# Patient Record
Sex: Female | Born: 1970 | Race: White | Hispanic: No | Marital: Single | Smoking: Never smoker
Health system: Southern US, Community
[De-identification: ages and names within clinical notes are randomized; demographics above are authoritative.]

## PROBLEM LIST (undated history)

## (undated) DIAGNOSIS — Z87442 Personal history of urinary calculi: Secondary | ICD-10-CM

## (undated) DIAGNOSIS — K5900 Constipation, unspecified: Secondary | ICD-10-CM

## (undated) DIAGNOSIS — N939 Abnormal uterine and vaginal bleeding, unspecified: Secondary | ICD-10-CM

## (undated) DIAGNOSIS — K219 Gastro-esophageal reflux disease without esophagitis: Secondary | ICD-10-CM

## (undated) DIAGNOSIS — G43829 Menstrual migraine, not intractable, without status migrainosus: Secondary | ICD-10-CM

## (undated) DIAGNOSIS — E039 Hypothyroidism, unspecified: Secondary | ICD-10-CM

## (undated) DIAGNOSIS — D649 Anemia, unspecified: Secondary | ICD-10-CM

## (undated) DIAGNOSIS — Z8489 Family history of other specified conditions: Secondary | ICD-10-CM

## (undated) DIAGNOSIS — M1990 Unspecified osteoarthritis, unspecified site: Secondary | ICD-10-CM

## (undated) DIAGNOSIS — D259 Leiomyoma of uterus, unspecified: Secondary | ICD-10-CM

## (undated) DIAGNOSIS — F419 Anxiety disorder, unspecified: Secondary | ICD-10-CM

## (undated) HISTORY — PX: CHOLECYSTECTOMY: SHX55

## (undated) HISTORY — DX: Gastro-esophageal reflux disease without esophagitis: K21.9

## (undated) HISTORY — DX: Anxiety disorder, unspecified: F41.9

## (undated) HISTORY — DX: Hypothyroidism, unspecified: E03.9

## (undated) HISTORY — DX: Anemia, unspecified: D64.9

## (undated) HISTORY — DX: Menstrual migraine, not intractable, without status migrainosus: G43.829

---

## 1998-08-21 ENCOUNTER — Ambulatory Visit (HOSPITAL_COMMUNITY): Admission: RE | Admit: 1998-08-21 | Discharge: 1998-08-21 | Payer: Self-pay | Admitting: Obstetrics and Gynecology

## 1998-08-21 ENCOUNTER — Encounter: Payer: Self-pay | Admitting: Obstetrics and Gynecology

## 1999-01-05 ENCOUNTER — Encounter: Payer: Self-pay | Admitting: Obstetrics and Gynecology

## 1999-01-05 ENCOUNTER — Inpatient Hospital Stay (HOSPITAL_COMMUNITY): Admission: AD | Admit: 1999-01-05 | Discharge: 1999-01-09 | Payer: Self-pay | Admitting: Obstetrics & Gynecology

## 1999-01-18 ENCOUNTER — Encounter (HOSPITAL_COMMUNITY): Admission: RE | Admit: 1999-01-18 | Discharge: 1999-04-18 | Payer: Self-pay | Admitting: Obstetrics and Gynecology

## 1999-02-08 ENCOUNTER — Other Ambulatory Visit: Admission: RE | Admit: 1999-02-08 | Discharge: 1999-02-08 | Payer: Self-pay | Admitting: Obstetrics and Gynecology

## 2000-02-27 ENCOUNTER — Encounter: Payer: Self-pay | Admitting: Family Medicine

## 2000-02-27 LAB — CONVERTED CEMR LAB

## 2000-03-03 ENCOUNTER — Other Ambulatory Visit: Admission: RE | Admit: 2000-03-03 | Discharge: 2000-03-03 | Payer: Self-pay | Admitting: Obstetrics and Gynecology

## 2001-03-04 ENCOUNTER — Other Ambulatory Visit: Admission: RE | Admit: 2001-03-04 | Discharge: 2001-03-04 | Payer: Self-pay | Admitting: Obstetrics and Gynecology

## 2002-05-24 ENCOUNTER — Other Ambulatory Visit: Admission: RE | Admit: 2002-05-24 | Discharge: 2002-05-24 | Payer: Self-pay | Admitting: Obstetrics and Gynecology

## 2004-06-04 ENCOUNTER — Ambulatory Visit: Payer: Self-pay | Admitting: Family Medicine

## 2004-07-19 ENCOUNTER — Ambulatory Visit: Payer: Self-pay | Admitting: Family Medicine

## 2004-07-29 LAB — CONVERTED CEMR LAB: Pap Smear: NORMAL

## 2005-03-13 ENCOUNTER — Emergency Department (HOSPITAL_COMMUNITY): Admission: EM | Admit: 2005-03-13 | Discharge: 2005-03-13 | Payer: Self-pay | Admitting: Family Medicine

## 2005-06-24 ENCOUNTER — Ambulatory Visit: Payer: Self-pay | Admitting: Family Medicine

## 2005-09-06 ENCOUNTER — Other Ambulatory Visit: Admission: RE | Admit: 2005-09-06 | Discharge: 2005-09-06 | Payer: Self-pay | Admitting: Obstetrics and Gynecology

## 2006-02-11 ENCOUNTER — Ambulatory Visit: Payer: Self-pay | Admitting: Family Medicine

## 2006-02-11 LAB — CONVERTED CEMR LAB: TSH: 6.4 microintl units/mL

## 2006-07-17 ENCOUNTER — Ambulatory Visit: Payer: Self-pay | Admitting: Family Medicine

## 2007-07-08 ENCOUNTER — Telehealth: Payer: Self-pay | Admitting: Family Medicine

## 2007-09-10 ENCOUNTER — Encounter: Payer: Self-pay | Admitting: Family Medicine

## 2007-09-10 DIAGNOSIS — F329 Major depressive disorder, single episode, unspecified: Secondary | ICD-10-CM

## 2007-09-10 DIAGNOSIS — K219 Gastro-esophageal reflux disease without esophagitis: Secondary | ICD-10-CM

## 2007-09-10 DIAGNOSIS — J45909 Unspecified asthma, uncomplicated: Secondary | ICD-10-CM | POA: Insufficient documentation

## 2007-09-10 DIAGNOSIS — E039 Hypothyroidism, unspecified: Secondary | ICD-10-CM | POA: Insufficient documentation

## 2007-11-26 ENCOUNTER — Telehealth: Payer: Self-pay | Admitting: Family Medicine

## 2007-11-27 ENCOUNTER — Encounter (INDEPENDENT_AMBULATORY_CARE_PROVIDER_SITE_OTHER): Payer: Self-pay | Admitting: *Deleted

## 2007-12-15 ENCOUNTER — Ambulatory Visit: Payer: Self-pay | Admitting: Family Medicine

## 2007-12-15 DIAGNOSIS — R5383 Other fatigue: Secondary | ICD-10-CM

## 2007-12-15 DIAGNOSIS — R5381 Other malaise: Secondary | ICD-10-CM | POA: Insufficient documentation

## 2007-12-18 LAB — CONVERTED CEMR LAB
Bilirubin, Direct: 0.1 mg/dL (ref 0.0–0.3)
Calcium: 8.4 mg/dL (ref 8.4–10.5)
Creatinine, Ser: 0.8 mg/dL (ref 0.4–1.2)
Eosinophils Absolute: 0.1 10*3/uL (ref 0.0–0.7)
Eosinophils Relative: 2.2 % (ref 0.0–5.0)
Folate: 6.5 ng/mL
GFR calc non Af Amer: 86 mL/min
HCT: 35.7 % — ABNORMAL LOW (ref 36.0–46.0)
HDL: 39.6 mg/dL (ref >39.0)
MCV: 77.4 fL — ABNORMAL LOW (ref 78.0–100.0)
Monocytes Absolute: 0.3 10*3/uL (ref 0.1–1.0)
Monocytes Relative: 5.7 % (ref 3.0–12.0)
Neutrophils Relative %: 59.6 % (ref 43.0–77.0)
Platelets: 213 10*3/uL (ref 150–400)
RDW: 14.8 % — ABNORMAL HIGH (ref 11.5–14.6)
Sodium: 141 meq/L (ref 135–145)
TSH: 2.68 microintl units/mL (ref 0.35–5.50)
Total Bilirubin: 0.6 mg/dL (ref 0.3–1.2)
Total CHOL/HDL Ratio: 4.6
Triglycerides: 86 mg/dL (ref 0–149)
VLDL: 17 mg/dL (ref 0–40)
Vitamin B-12: 329 pg/mL (ref 211–911)
WBC: 5.9 10*3/uL (ref 4.5–10.5)

## 2008-04-19 ENCOUNTER — Telehealth: Payer: Self-pay | Admitting: Family Medicine

## 2008-04-21 ENCOUNTER — Emergency Department: Payer: Self-pay | Admitting: Emergency Medicine

## 2008-07-16 ENCOUNTER — Encounter: Payer: Self-pay | Admitting: Family Medicine

## 2008-08-10 ENCOUNTER — Ambulatory Visit: Payer: Self-pay | Admitting: Family Medicine

## 2008-08-10 DIAGNOSIS — D649 Anemia, unspecified: Secondary | ICD-10-CM

## 2008-08-11 LAB — CONVERTED CEMR LAB
AST: 22 units/L (ref 0–37)
Basophils Absolute: 0 10*3/uL (ref 0.0–0.1)
Basophils Relative: 0.6 % (ref 0.0–3.0)
Bilirubin, Direct: 0.1 mg/dL (ref 0.0–0.3)
Chloride: 104 meq/L (ref 96–112)
Cholesterol: 181 mg/dL (ref 0–200)
Creatinine, Ser: 0.6 mg/dL (ref 0.4–1.2)
Eosinophils Absolute: 0.2 10*3/uL (ref 0.0–0.7)
GFR calc non Af Amer: 120 mL/min
HDL: 48 mg/dL (ref >39.0)
LDL Cholesterol: 124 mg/dL — ABNORMAL HIGH (ref 0–99)
MCHC: 33.7 g/dL (ref 30.0–36.0)
MCV: 75.3 fL — ABNORMAL LOW (ref 78.0–100.0)
Neutrophils Relative %: 61.8 % (ref 43.0–77.0)
Platelets: 205 10*3/uL (ref 150–400)
RDW: 15.1 % — ABNORMAL HIGH (ref 11.5–14.6)
Sodium: 139 meq/L (ref 135–145)
TSH: 3.8 microintl units/mL (ref 0.35–5.50)
Total Bilirubin: 0.5 mg/dL (ref 0.3–1.2)
Triglycerides: 43 mg/dL (ref 0–149)
VLDL: 9 mg/dL (ref 0–40)

## 2008-09-27 ENCOUNTER — Ambulatory Visit: Payer: Self-pay | Admitting: Family Medicine

## 2008-09-29 ENCOUNTER — Telehealth (INDEPENDENT_AMBULATORY_CARE_PROVIDER_SITE_OTHER): Payer: Self-pay | Admitting: Internal Medicine

## 2008-10-01 ENCOUNTER — Telehealth: Payer: Self-pay | Admitting: Internal Medicine

## 2008-10-06 ENCOUNTER — Telehealth: Payer: Self-pay | Admitting: Family Medicine

## 2008-12-23 ENCOUNTER — Telehealth (INDEPENDENT_AMBULATORY_CARE_PROVIDER_SITE_OTHER): Payer: Self-pay | Admitting: *Deleted

## 2008-12-28 ENCOUNTER — Ambulatory Visit: Payer: Self-pay | Admitting: Family Medicine

## 2008-12-28 DIAGNOSIS — E785 Hyperlipidemia, unspecified: Secondary | ICD-10-CM

## 2009-04-17 ENCOUNTER — Telehealth: Payer: Self-pay | Admitting: Family Medicine

## 2009-05-02 ENCOUNTER — Telehealth: Payer: Self-pay | Admitting: Family Medicine

## 2009-09-07 ENCOUNTER — Telehealth: Payer: Self-pay | Admitting: Family Medicine

## 2009-10-14 ENCOUNTER — Emergency Department: Payer: Self-pay | Admitting: Emergency Medicine

## 2010-02-21 ENCOUNTER — Telehealth: Payer: Self-pay | Admitting: Family Medicine

## 2010-04-13 ENCOUNTER — Emergency Department (HOSPITAL_COMMUNITY): Admission: EM | Admit: 2010-04-13 | Discharge: 2010-04-13 | Payer: Self-pay | Admitting: Emergency Medicine

## 2010-04-23 ENCOUNTER — Telehealth: Payer: Self-pay | Admitting: Family Medicine

## 2010-05-27 ENCOUNTER — Inpatient Hospital Stay: Payer: Self-pay | Admitting: Emergency Medicine

## 2010-06-08 ENCOUNTER — Telehealth (INDEPENDENT_AMBULATORY_CARE_PROVIDER_SITE_OTHER): Payer: Self-pay | Admitting: *Deleted

## 2010-06-11 ENCOUNTER — Telehealth: Payer: Self-pay | Admitting: Family Medicine

## 2010-06-13 ENCOUNTER — Encounter (INDEPENDENT_AMBULATORY_CARE_PROVIDER_SITE_OTHER): Payer: Self-pay | Admitting: *Deleted

## 2010-06-15 ENCOUNTER — Ambulatory Visit: Payer: Self-pay | Admitting: Family Medicine

## 2010-06-17 LAB — CONVERTED CEMR LAB
ALT: 16 units/L (ref 0–35)
AST: 19 units/L (ref 0–37)
BUN: 16 mg/dL (ref 6–23)
Bilirubin, Direct: 0.1 mg/dL (ref 0.0–0.3)
Cholesterol: 214 mg/dL — ABNORMAL HIGH (ref 0–200)
Creatinine, Ser: 0.7 mg/dL (ref 0.4–1.2)
Eosinophils Relative: 3.7 % (ref 0.0–5.0)
GFR calc non Af Amer: 104 mL/min (ref >60)
HDL: 46 mg/dL (ref >39.00)
Monocytes Relative: 6 % (ref 3.0–12.0)
Neutrophils Relative %: 55.6 % (ref 43.0–77.0)
Platelets: 216 10*3/uL (ref 150.0–400.0)
Potassium: 3.9 meq/L (ref 3.5–5.1)
Total Bilirubin: 0.5 mg/dL (ref 0.3–1.2)
VLDL: 18.8 mg/dL (ref 0.0–40.0)
WBC: 5.6 10*3/uL (ref 4.5–10.5)

## 2010-07-17 ENCOUNTER — Ambulatory Visit: Payer: Self-pay | Admitting: Family Medicine

## 2010-08-28 NOTE — Progress Notes (Signed)
Summary: Levothyroxin 125 mcg refill  Phone Note Refill Request Call back at [phone] Message from:  [facility] on February 21, 2010 11:12 AM  Refills Requested: Medication #1:  SYNTHROID 125 MCG  TABS take one by mouth daily [facility] electronically requested refill for Levothyroxine 125 micrograms. No refill date sent.  On 04/21/09 one year refill given to pt to [facility]. Unable to reach pt but noticed pt was seen 12/28/2008 for CPX and was to return for pap smear. Pt cancelled 01/02/09, 01/03/09 and 01/10/09. Pt recently cancelled 12/27/2009. Please advise.    Method Requested: Telephone to Pharmacy Initial call taken by: Lewanda Rife LPN,  February 21, 2010 11:14 AM  Follow-up for Phone Call        she is overdue for lab and visit refil 1 mo ask her to f/u please  Follow-up by: Judith Part MD,  February 21, 2010 12:10 PM  Additional Follow-up for Phone Call Additional follow up Details #1::        Unable to reach pt by phone. Added note to rx for pt to call for lab appt and f/u appt with Dr Milinda Antis. Medication sent electronically to [facility].Lewanda Rife LPN  February 21, 2010 1:09 PM

## 2010-08-28 NOTE — Progress Notes (Signed)
----   Converted from flag ---- ---- 06/07/2010 6:48 PM, Colon Flattery Tower MD wrote: please check wellness/ lipid v70.0 and 272 and 244.9 thanks   ---- 06/07/2010 2:01 PM, Liane Comber CMA (AAMA) wrote: Lab orders please! Good Morning! This pt is scheduled for cpx labs Wed, which labs to draw and dx codes to use? Thanks Tasha ------------------------------

## 2010-08-28 NOTE — Progress Notes (Signed)
Summary: call a nurse   Phone Note Call from Patient   Summary of Call: Triage Record Num: [id number] Operator: Durward Mallard DiMatteis Patient Name: Miranda Gardner Call Date & Time: 04/21/2010 10:38:00AM Patient Phone: [phone] PCP: Audrie Gallus. Tower Patient Gender: Female PCP Fax : Patient DOB: [birth year] Practice Name: [facility] Reason for Call: Mom calling and states that daughter has severe vomiting and diarrhea; would like phenergan suppositories; the patient's daughter had the stomach bug the other day and now [name] has the sx; pt has had no diarrhea today; x1 last night; vomited x 2 so far today; pt is nausea; no fever; phenrgan 25mg  supp 1 q4hr prn #6 NR; [facility] [phone]; homecare nausea and vomiting guideline Protocol(s) Used: Nausea or Vomiting Recommended Outcome per Protocol: Provide Home/Self Care Reason for Outcome: All other situations Care Advice:  ~ SYMPTOM / CONDITION MANAGEMENT Nausea Care Advice: - Drink small amounts of clear, sweetened liquids or ice cold drinks. - Eat light, bland foods such as saltine crackers or plain bread. - Do not eat high fat, highly seasoned, high fiber, or high sugar content foods. - Avoid mixing hot food and cold foods. - Eat smaller, more frequent meals. - Rest as much as possible in a sitting or in a propped lying position. Do not lie flat for at least 2 hours after eating. - Do not take pain medication (such as aspirin, NSAIDs) while nauseated. - Rest as much as possible until symptoms improve since activity may worsen nausea.  ~ 09/ Initial call taken by: Melody Comas,  April 23, 2010 9:16 AM

## 2010-08-28 NOTE — Progress Notes (Signed)
Summary: Sertraline  Phone Note Refill Request Message from:  [employer] on June 11, 2010 5:11 PM  Refills Requested: Medication #1:  ZOLOFT 100 MG  TABS 1 1/2 [facility] mouth once [facility]  [address] [id number]      I think this patient may need a CPX, please advise.   Last Fill Date:  No date sent   Pharmacy Phone:  [phone]   Method Requested: Electronic Initial call taken by: Delilah Shan CMA Duncan Dull),  June 11, 2010 5:13 PM  Follow-up for Phone Call        please schedule PE when able px written on EMR for call in   Patient Advised by VM Delilah Shan CMA (AAMA)  June 12, 2010 10:38 AM      New/Updated Medications: ZOLOFT 100 MG  TABS (SERTRALINE HCL) 1 1/2 by mouth once daily Prescriptions: ZOLOFT 100 MG  TABS (SERTRALINE HCL) 1 1/2 by mouth once daily  #3 months x 0   Entered by:   Delilah Shan CMA (AAMA)   Authorized by:   Judith Part MD   Signed by:   Delilah Shan CMA (AAMA) on 06/12/2010   Method used:   [facility]        [id number]       [address]       [place], [place]  [address]       Ph: [phone]       Fax: [phone]   RxID:   [id number]

## 2010-08-28 NOTE — Progress Notes (Signed)
Summary: requests muscle relaxer  Phone Note Call from Patient Call back at Home Phone [phone]   Caller: Patient Call For: Judith Part MD Summary of Call: Pt was working with a patient earlier in the week and she thinks she pulled a muscle in her back.  She has pain and a knot in upper mid back.  She is asking if a muscle relaxer can be called in to [facility]. She is taking advil and using heat for it now. Initial call taken by: Lowella Petties CMA,  September 07, 2009 11:34 AM  Follow-up for Phone Call        cannot do that without visit- since muscle relaxer is controlled I recommend trial of aleve (nsaid)- as long as it does not bother her stomach -- 2 pills two times a day with food  also gentle heat and stretches (avoid heavy lifting) if not improved in several days f/u (or if worse) Follow-up by: Judith Part MD,  September 07, 2009 11:54 AM  Additional Follow-up for Phone Call Additional follow up Details #1::        Left message for patient to call back. Lewanda Rife LPN  September 07, 2009 12:30 PM   Left message for patient to call back.Lewanda Rife LPN  September 08, 2009 10:50 AM   Pt called back while I was in a room with pt. I returned her call but had to leave message for pt to call back.Lewanda Rife LPN  September 08, 2009 1:14 PM     Additional Follow-up for Phone Call Additional follow up Details #2::    Patient states that this is a worker's compensation injury and she had to see their doctor and she is at the MD office right now.  Will not need the above instructions.  Delilah Shan CMA Duncan Dull)  September 08, 2009 2:14 PM

## 2010-08-28 NOTE — Letter (Signed)
Summary: [place] No Show Letter  [place] [facility]  [address] [place], [place] [address]   Phone: [phone]  Fax: [phone]    06/13/2010 MRN: [id number]  [facility] [place] [address] [place], [place]  [address]   Dear Miranda Gardner,   Our records indicate that you missed your scheduled appointment with ___Lab__________________ on ___11.16.11_________.  Please contact this office to reschedule your appointment as soon as possible.  It is important that you keep your scheduled appointments with your physician, so we can provide you the best care possible.  Please be advised that there may be a charge for "no show" appointments.    Sincerely,   [place] at [facility]

## 2010-08-30 NOTE — Assessment & Plan Note (Signed)
Summary: CPX/CLE   R/S FROM 06/19/10/CLE   Vital Signs:  Patient profile:   40 year old female Height:      67.5 inches Weight:      235.25 pounds BMI:     36.43 Temp:     97.6 degrees F oral Pulse rate:   80 / minute Pulse rhythm:   regular BP sitting:   130 / 84  (left arm) Cuff size:   large  Vitals Entered By: Lewanda Rife LPN (July 17, 2010 3:36 PM) CC: CPX but pt is on menstrual  period very heavy flow now.   History of Present Illness: here for wellness exam - will need to resched pap due to menses   has been feeling ok - except fatigue    wt is down 11 lb with bmi of 36 has really been working on it  gained 2 lb back since the holidays wants to start riding horses    ccy this year  also few kidney stones  bp 130/84  tsh is up at 8.22 off her synthroid for 11/2 months -- and felt tired  has not started it back yet  needs a px  has been on it since her 20s      mild anemia at 11.2 -- fairly stable and iron def is bad about taking her iron -- will get back to it  has heavy menses  chol up significantly trig 94 and HDL 46 and LDL up to 154 from the 120s is not a big meat eater or cheese   Td 07  pap = will return for  last one 06    flu shot up to date   Allergies: 1)  ! Cipro 2)  ! Erythromycin 3)  ! * Prometh= Codiene Cough Med 4)  ! 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 Eyes:  Denies blurring and eye irritation. CV:  Denies chest pain or discomfort, palpitations, and shortness of breath with exertion. Resp:  Denies cough, shortness of breath, and wheezing. GI:  Denies abdominal pain, change in bowel habits, indigestion, and nausea. GU:  Denies abnormal vaginal bleeding, discharge, dysuria, and urinary frequency. MS:  Denies joint pain, muscle aches, and cramps. Derm:  Denies itching, lesion(s), poor wound healing, and rash. Neuro:  Denies numbness and tingling. Psych:  Denies anxiety and depression. Endo:  Denies cold intolerance, excessive thirst, excessive urination, and heat intolerance. Heme:  Denies abnormal bruising and bleeding.  Physical Exam  General:  overweight but generally well appearing  Head:  normocephalic, atraumatic, and no abnormalities observed.   Eyes:  vision grossly intact, pupils equal, pupils round, and pupils reactive to light.  no conjunctival pallor, injection or icterus  Mouth:  pharynx pink and  moist.   Neck:  supple with full rom and no masses or thyromegally, no JVD or carotid bruit  Chest Wall:  No deformities, masses, or tenderness noted. Lungs:  Normal respiratory effort, chest expands symmetrically. Lungs are clear to auscultation, no crackles or wheezes. Heart:  Normal rate and regular rhythm. S1 and S2 normal without gallop, murmur, click, rub or other extra sounds. Abdomen:  Bowel sounds positive,abdomen soft and non-tender without masses, organomegaly or hernias noted. no renal bruits  Msk:  No deformity or scoliosis noted of thoracic or lumbar spine.  no acute joint changes  Pulses:  R and L carotid,radial,femoral,dorsalis pedis and posterior tibial pulses are full and equal bilaterally Extremities:  No clubbing,  cyanosis, edema, or deformity noted with normal full range of motion of all joints.   Neurologic:  sensation intact to light touch, gait normal, and DTRs symmetrical and normal.   Skin:  Intact without suspicious lesions or rashes Cervical Nodes:  No lymphadenopathy noted Inguinal Nodes:  No significant adenopathy Psych:  normal affect, talkative and pleasant    Impression & Recommendations:  Problem # 1:  HEALTH MAINTENANCE EXAM (ICD-V70.0) Assessment Comment Only reviewed health habits including diet, exercise and skin cancer prevention reviewed health maintenance list and family history rev labs in detail   Problem # 2:  ANEMIA, MILD (ICD-285.9) Assessment: Unchanged  enc pt to get back on iron  anemia iron def from menses pt tolerates menses   Hgb: 11.2 (06/15/2010)   Hct: 33.4 (06/15/2010)   Platelets: 216.0 (06/15/2010) RBC: 4.32 (06/15/2010)   RDW: 16.4 (06/15/2010)   WBC: 5.6 (06/15/2010) MCV: 77.4 (06/15/2010)   MCHC: 33.5 (06/15/2010) B12: 329 (12/15/2007)   Folate: 6.5 (12/15/2007)   TSH: 8.22 (06/15/2010)  Problem # 3:  HYPOTHYROIDISM (ICD-244.9) Assessment: Deteriorated  tsh high off med as suspected- also symptomatic will get back on old dose re check in 2 mo and update Her updated medication list for this problem includes:    Synthroid 125 Mcg Tabs (Levothyroxine sodium) .Marland Kitchen... Take one by mouth daily  Labs Reviewed: TSH: 8.22 (06/15/2010)    Chol: 214 (06/15/2010)   HDL: 46.00 (06/15/2010)   LDL: 124 (08/10/2008)   TG: 94.0 (06/15/2010)  Orders: Prescription Created Electronically [phone])  Problem # 4:  HYPERLIPIDEMIA (ICD-272.4) Assessment: Deteriorated  this is worse rev diet - not too bad this may be genetic re check 2 mo -consider statin if not imp  Labs Reviewed: SGOT: 19 (06/15/2010)   SGPT: 16 (06/15/2010)   HDL:46.00 (06/15/2010), 48.0 (08/10/2008)  LDL:124 (08/10/2008), 127 (12/15/2007)  Chol:214 (06/15/2010), 181 (08/10/2008)  Trig:94.0  (06/15/2010), 43 (08/10/2008)  Orders: Prescription Created Electronically [phone])  Complete Medication List: 1)  Zoloft 100 Mg Tabs (Sertraline hcl) .Marland Kitchen.. 1 1/2 by mouth once daily 2)  Synthroid 125 Mcg Tabs (Levothyroxine sodium) .... Take one by mouth daily 3)  Maxalt 10 Mg Tabs (Rizatriptan benzoate) .... Take by mouth as directed prn 4)  Fish Oil Oil (Fish oil) .... Take 1 tablet by mouth once a day 5)  Iron Tabs (iron)  .... 2 tabs by mouth once daily 6)  Advil 200 Mg Tabs (Ibuprofen) .... Otc as directed.  Patient Instructions: 1)  you can raise your HDL (good cholesterol) by increasing exercise and eating omega 3 fatty acid supplement like fish oil or flax seed oil over the counter 2)  you can lower LDL (bad cholesterol) by limiting saturated fats in diet like red meat, fried foods, egg yolks, fatty breakfast meats,  high fat dairy products and shellfish  3)  start back on thyroid medicine 4)  schedule fasting lab in 2 months tsh/ lipid/ast/alt 272, 244.9  5)  schedule 15 min f/u for pap only when able and not on menses Prescriptions: MAXALT 10 MG  TABS (RIZATRIPTAN BENZOATE) take by mouth as directed prn  #27 x 3   Entered and Authorized by:   Judith Part MD   Signed by:   Judith Part MD on 07/17/2010   Method used:   [facility]        [id number]       [address]       [place], [place]  [address]       Ph: [phone]       Fax: [phone]   RxID:   [id number] SYNTHROID 125 MCG  TABS (LEVOTHYROXINE SODIUM) take one by mouth daily  #90 x 3   Entered and Authorized by:   Judith Part MD   Signed by:   Judith Part MD on 07/17/2010   Method used:   Electronically to        [id number]       [address]       [place], [place]  [address]       Ph: [phone]       Fax: [phone]   RxID:   [id number] ZOLOFT 100 MG  TABS (SERTRALINE HCL) 1 1/2 by mouth once daily  #3 months x 3    Entered and Authorized by:   Judith Part MD   Signed by:   Judith Part MD on 07/17/2010   Method used:   Electronically to        [id number]       [address]       [place], [place]  [address]       Ph: [phone]       Fax: [phone]   RxID:   [id number]    Orders Added: 1)  Prescription Created Electronically [G8553] 2)  Est. Patient 18-39 years [99395]   Immunization History:  Influenza Immunization History:    Influenza:  historical received at work (06/27/2010)   Immunization History:  Influenza Immunization History:    Influenza:  Historical received at work (06/27/2010)  Current Allergies (reviewed today): ! CIPRO ! ERYTHROMYCIN ! * PROMETH= CODIENE COUGH MED ! MORPHINE

## 2010-10-11 LAB — URINE MICROSCOPIC-ADD ON

## 2010-10-11 LAB — URINALYSIS, ROUTINE W REFLEX MICROSCOPIC
Nitrite: NEGATIVE
Specific Gravity, Urine: 1.027 (ref 1.005–1.030)
pH: 5 (ref 5.0–8.0)

## 2010-10-11 LAB — POCT PREGNANCY, URINE: Preg Test, Ur: NEGATIVE

## 2010-12-01 ENCOUNTER — Encounter: Payer: Self-pay | Admitting: Family Medicine

## 2010-12-07 ENCOUNTER — Ambulatory Visit (INDEPENDENT_AMBULATORY_CARE_PROVIDER_SITE_OTHER): Payer: 59 | Admitting: Family Medicine

## 2010-12-07 ENCOUNTER — Encounter: Payer: Self-pay | Admitting: Family Medicine

## 2010-12-07 DIAGNOSIS — G479 Sleep disorder, unspecified: Secondary | ICD-10-CM

## 2010-12-07 DIAGNOSIS — G43909 Migraine, unspecified, not intractable, without status migrainosus: Secondary | ICD-10-CM | POA: Insufficient documentation

## 2010-12-07 MED ORDER — CLONAZEPAM 0.5 MG PO TABS: ORAL_TABLET | ORAL | Status: DC

## 2010-12-07 NOTE — Assessment & Plan Note (Signed)
Previously just menstrual but has evolved into chronic daily ha Suspect sleep disorder/ shift change/ caff and recent wt loss all play a part Long disc about lifestyle Will quit caff gradually Disc medication overuse and plan for that Klonopin for sleep Will update  If worse knows to call asap

## 2010-12-07 NOTE — Assessment & Plan Note (Signed)
With shift change- is fueling chronic daily ha  Has tried otc agents  Want to avoid class of ambien due to side eff of headache  Trial of klonopin 1-2 at bedtime on days she does not work at night Update if not imp

## 2010-12-07 NOTE — Patient Instructions (Signed)
Gradually get off caffeine Use the klonopin .5 mg 1-2  At bedtime on days you cannot sleep Use caution  Gradually get off caffeine  Use headache medication sparingly if possible  [employer] for more severe headaches  Update me in 1-2 months / or earlier if not helping  If symptoms worsen at any time update me

## 2010-12-07 NOTE — Progress Notes (Signed)
Subjective:    Patient ID: Miranda Gardner, female    DOB: 05/26/1971, 40 y.o.   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 Tried melatonin and benadryl  Used mom's xanax one night - gave her 4 hours of sleep  3 on pain scale today Does get severe at times -- with nausea and no vomiting  Has used some left over phenergan   Needs to do something about sleep  Work 7p to 7am -- works 3 days per week  Prefers the night shift Non work days goes to bed 9 and gets up at midnight - and intermittent sleep the next day  On work days goes to bed at 10 am and sleep till 5 pm   May have to change to day position and looking at management position- get her masters in the future   Does drink caffiene  Did cut way back on coffee darkk tea every am  Decaf tea at night  Diet pepsi once per day   Using sensa for 2 months -- helps appetite  Did not see side eff of headache   Does not need birth control       Wt is down 15 lb -- still working on that    Review of Systems Review of Systems  Constitutional: Negative for fever, appetite change, fatigue and unexpected weight change.  Eyes: Negative for pain and visual disturbance.  Respiratory: Negative for cough and shortness of breath.   Cardiovascular: Negative for cp or sob or edema or heart M Gastrointestinal: Negative for nausea, diarrhea and constipation.  Genitourinary: Negative for urgency and frequency.  Skin: Negative for pallor.  Neurological: Negative for weakness, light-headedness, numbness and pos for headaches  Hematological: Negative for adenopathy. Does not bruise/bleed easily.  Psychiatric/Behavioral: Negative for dysphoric mood. The patient is not nervous/anxious.           Objective:   Physical Exam  Constitutional: She is oriented to person, place, and time. She appears well-developed and well-nourished. No distress.       overwt and well appearing   HENT:  Head: Normocephalic and atraumatic.  Right Ear: External ear normal.  Left Ear: External ear normal.  Nose: Nose normal.  Mouth/Throat: Oropharynx is clear and moist.       No sinus tenderness   Eyes: Conjunctivae and EOM are normal. Pupils are equal, round, and reactive to light.       Fundi grossly wnl  Neck: Normal range of motion. Neck supple. No JVD present. No thyromegaly present.  Cardiovascular: Normal rate, regular rhythm and normal heart sounds.   No murmur heard. Pulmonary/Chest: Effort normal and breath sounds normal. No respiratory distress. She has no rales. She exhibits no tenderness.  Abdominal: Soft. Bowel sounds are normal. She exhibits no distension and no mass. There is no tenderness.  Musculoskeletal: Normal range of motion. She exhibits no edema and no tenderness.  Lymphadenopathy:    She has no cervical adenopathy.  Neurological: She is alert and oriented to person, place, and time. She has normal strength and normal reflexes. No cranial nerve deficit or sensory deficit. She displays a negative Romberg sign. Coordination and gait  normal.  Skin: Skin is warm and intact. No pallor.  Psychiatric: She has a normal mood and affect.       cheerful          Assessment & Plan:

## 2011-01-28 ENCOUNTER — Other Ambulatory Visit: Payer: Self-pay

## 2011-01-28 MED ORDER — CLONAZEPAM 0.5 MG PO TABS: ORAL_TABLET | ORAL | Status: DC

## 2011-01-28 NOTE — Telephone Encounter (Deleted)
Opened note in error.

## 2011-01-28 NOTE — Telephone Encounter (Signed)
[facility]  

## 2011-01-28 NOTE — Telephone Encounter (Signed)
[facility] fax[facility] refill request for Clonazepam 0.5mg  #60 to take 1-2 tablets by mouth at bedtime for sleep.Please advise.Marland Kitchen

## 2011-01-28 NOTE — Telephone Encounter (Signed)
Px written for call in   

## 2011-02-05 ENCOUNTER — Ambulatory Visit: Payer: 59 | Admitting: Family Medicine

## 2011-02-20 ENCOUNTER — Telehealth: Payer: Self-pay | Admitting: *Deleted

## 2011-02-20 MED ORDER — PROMETHAZINE HCL 25 MG PO TABS: 25.0000 mg | ORAL_TABLET | Freq: Three times a day (TID) | ORAL | Status: DC | PRN

## 2011-02-20 NOTE — Telephone Encounter (Signed)
That is ok  Careful of sedation Will send electronically

## 2011-02-20 NOTE — Telephone Encounter (Signed)
Pt states she has a history of migraines and she is asking for something for nausea, which she gets with the headaches.  She would prefer phenergan tablets, [facility].

## 2011-02-20 NOTE — Telephone Encounter (Signed)
Patient notified as instructed by telephone. 

## 2011-05-03 ENCOUNTER — Ambulatory Visit: Payer: 59 | Admitting: Family Medicine

## 2011-05-08 ENCOUNTER — Ambulatory Visit: Payer: 59 | Admitting: Family Medicine

## 2011-05-08 DIAGNOSIS — Z0289 Encounter for other administrative examinations: Secondary | ICD-10-CM

## 2011-05-09 ENCOUNTER — Ambulatory Visit: Payer: 59 | Admitting: Family Medicine

## 2011-05-19 IMAGING — CR DG CHOLANGIOGRAM OPERATIVE
1 series · 3 of 3 positions shown · non-contrast
Comparison: none

REASON FOR EXAM: CHOLELITHIASIS
COMMENTS:

[Series 6001: (person_name) · 3 of 3 slices shown]
[im 1/3]
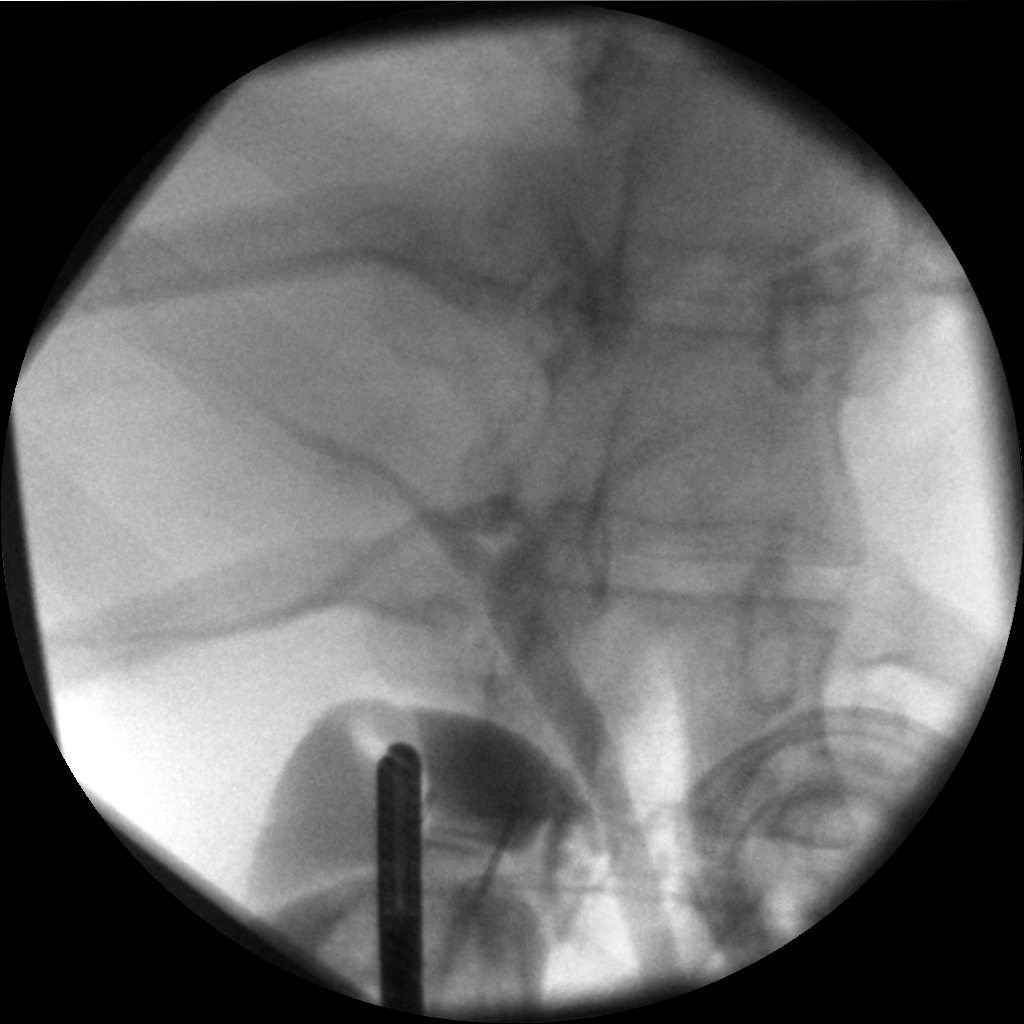
[im 2/3]
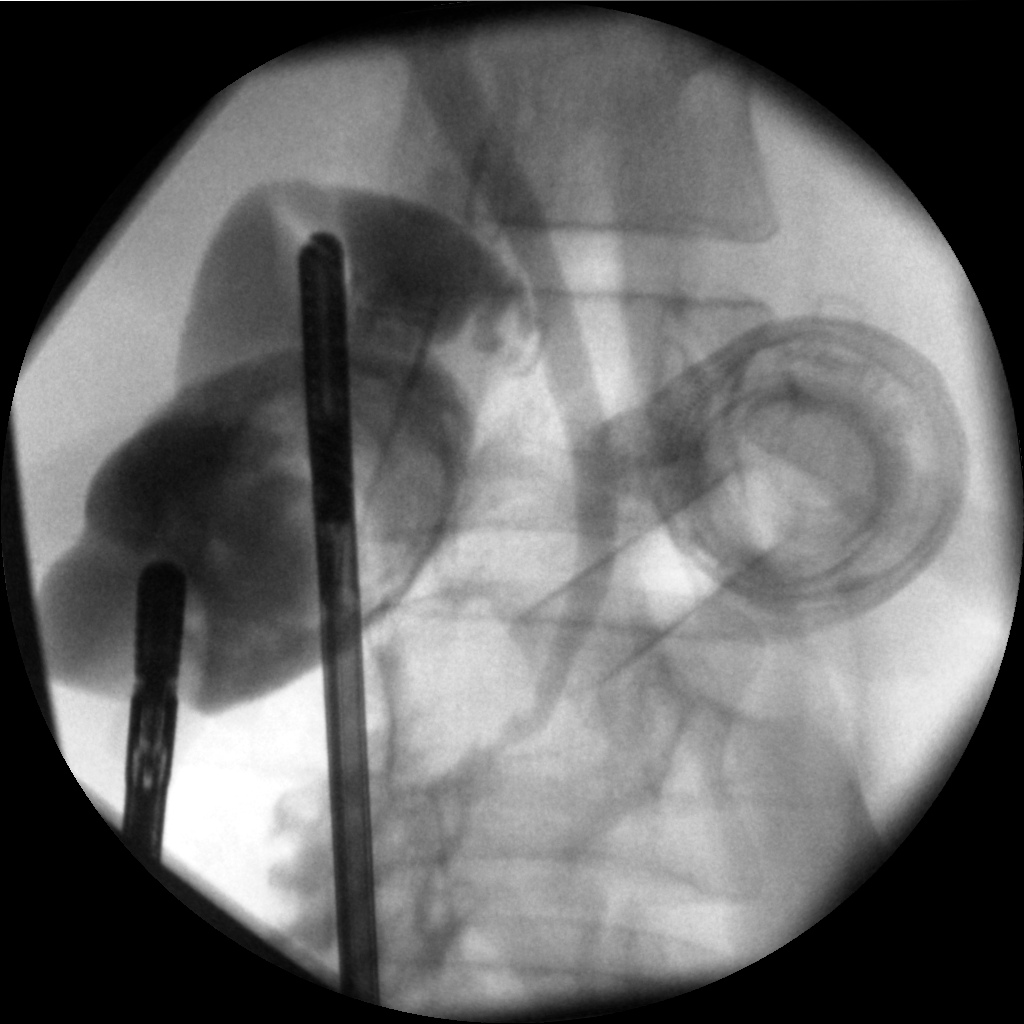
[im 3/3]
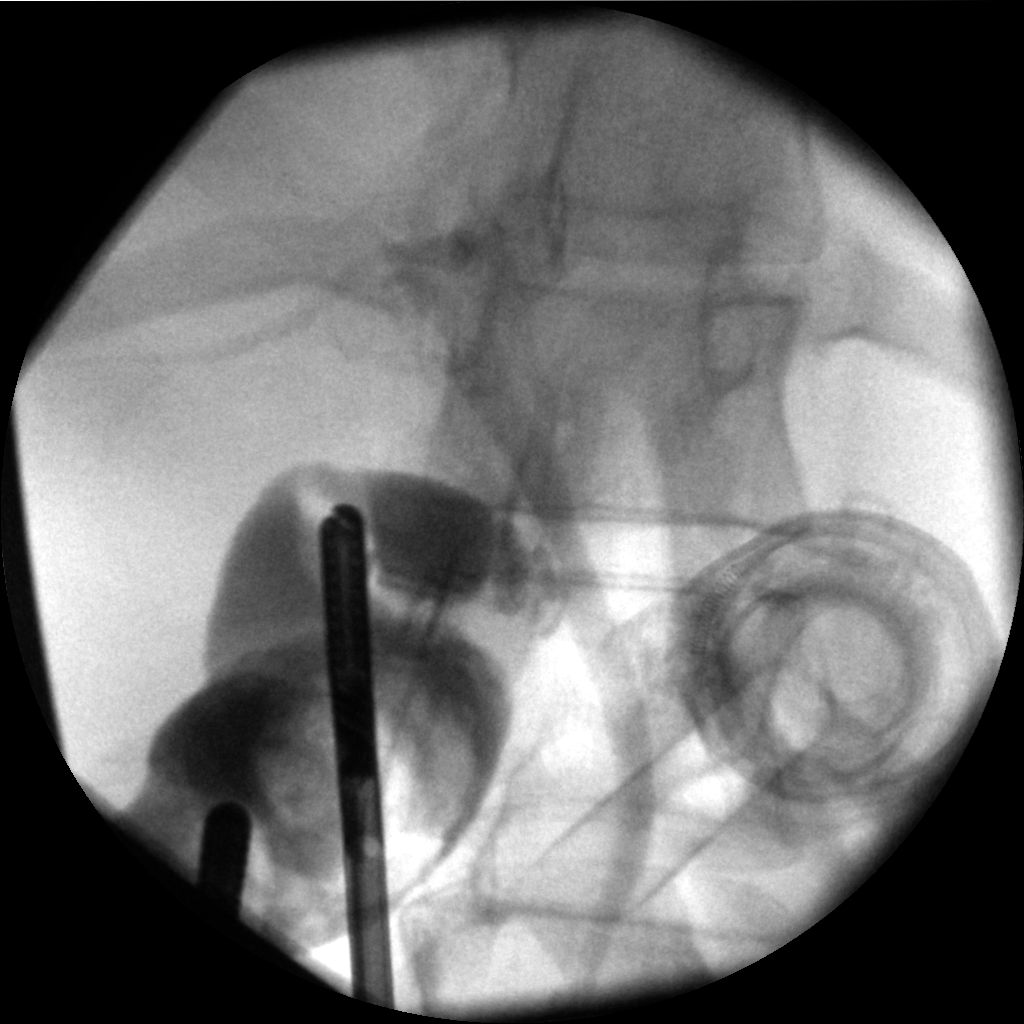

[3 of 3 positions shown; findings below may reference images not displayed]

PROCEDURE:     DXR - DXR CHOLANGIOGRAM OP (INITIAL)  - May 28, 2010 [DATE]

RESULT:     Digital C-arm images from an Intra-Operative Laparoscopic
Cholangiogram demonstrate the opacified common duct appears be normal in
caliber without definite obstruction. There is a collection of contrast in
what appears to be a distended gallbladder. The cystic duct is partially
seen. Filling defects appear to be present in the gallbladder.
IMPRESSION: No evidence of biliary ductal filling defect or obstruction.

## 2011-06-26 ENCOUNTER — Other Ambulatory Visit: Payer: Self-pay

## 2011-06-26 MED ORDER — CLONAZEPAM 0.5 MG PO TABS: ORAL_TABLET | ORAL | Status: DC

## 2011-06-26 NOTE — Telephone Encounter (Signed)
[facility] refill request Clonazepam 0.5 mg. Pt last seen 12/07/10 and med last filled 05/24/11.Please advise.

## 2011-06-26 NOTE — Telephone Encounter (Signed)
Px written for call in   

## 2011-06-26 NOTE — Telephone Encounter (Signed)
Medication phoned to [facility] as instructed.  

## 2011-07-18 ENCOUNTER — Other Ambulatory Visit: Payer: Self-pay | Admitting: *Deleted

## 2011-07-18 MED ORDER — SERTRALINE HCL 100 MG PO TABS: ORAL_TABLET | ORAL | Status: DC

## 2011-07-18 NOTE — Telephone Encounter (Signed)
Yes- can refil since seen within the year Will refill electronically

## 2011-07-18 NOTE — Telephone Encounter (Signed)
OK to refill? Last seen 5/12.

## 2011-07-24 ENCOUNTER — Other Ambulatory Visit: Payer: Self-pay | Admitting: Family Medicine

## 2011-07-24 NOTE — Telephone Encounter (Signed)
[facility] request refill on Levothyroxine . Pt seen 07/17/10 by Dr Milinda Antis and was supposed to f/u 2 mths because on labs dated 06/15/10 TSH was 8.22. I cannot see where pt had labs since then. Please advise.  The Sertraline 100 mg was sent to [facility] [address] 07/18/11 #45 x 11.

## 2011-07-24 NOTE — Telephone Encounter (Signed)
Will refill electronically Please make her appt with me - will do labs that day

## 2011-07-31 NOTE — Telephone Encounter (Signed)
Patient notified and appt scheduled.

## 2011-09-11 ENCOUNTER — Ambulatory Visit: Payer: 59 | Admitting: Family Medicine

## 2011-09-11 DIAGNOSIS — Z0289 Encounter for other administrative examinations: Secondary | ICD-10-CM

## 2011-11-08 ENCOUNTER — Other Ambulatory Visit: Payer: Self-pay | Admitting: Family Medicine

## 2011-11-08 NOTE — Telephone Encounter (Signed)
Will refill electronically  

## 2011-11-19 ENCOUNTER — Other Ambulatory Visit: Payer: Self-pay | Admitting: Family Medicine

## 2011-11-19 NOTE — Telephone Encounter (Signed)
[facility]ed [facility] [address] for refill on promethazine 25 mg tablets, last filled 15 on 04/26/11.  Not on med list.  Pt takes this for nausea with headaches.

## 2011-11-20 ENCOUNTER — Ambulatory Visit: Payer: 59 | Admitting: Family Medicine

## 2011-11-21 ENCOUNTER — Other Ambulatory Visit: Payer: Self-pay | Admitting: *Deleted

## 2011-11-21 MED ORDER — PROMETHAZINE HCL 25 MG PO TABS: ORAL_TABLET | ORAL | Status: DC

## 2011-11-21 NOTE — Telephone Encounter (Signed)
Last refill 04/26/2011.

## 2011-11-21 NOTE — Telephone Encounter (Signed)
Will refill electronically  

## 2011-11-27 ENCOUNTER — Encounter: Payer: Self-pay | Admitting: Family Medicine

## 2011-11-27 ENCOUNTER — Ambulatory Visit (INDEPENDENT_AMBULATORY_CARE_PROVIDER_SITE_OTHER): Payer: No Typology Code available for payment source | Admitting: Family Medicine

## 2011-11-27 VITALS — BP 110/70 | HR 69 | Temp 98.9°F | Ht 67.5 in | Wt 202.5 lb

## 2011-11-27 DIAGNOSIS — E669 Obesity, unspecified: Secondary | ICD-10-CM

## 2011-11-27 DIAGNOSIS — R5381 Other malaise: Secondary | ICD-10-CM

## 2011-11-27 DIAGNOSIS — G43909 Migraine, unspecified, not intractable, without status migrainosus: Secondary | ICD-10-CM

## 2011-11-27 DIAGNOSIS — R5383 Other fatigue: Secondary | ICD-10-CM

## 2011-11-27 DIAGNOSIS — N920 Excessive and frequent menstruation with regular cycle: Secondary | ICD-10-CM | POA: Insufficient documentation

## 2011-11-27 DIAGNOSIS — E039 Hypothyroidism, unspecified: Secondary | ICD-10-CM

## 2011-11-27 LAB — CBC WITH DIFFERENTIAL/PLATELET
Basophils Absolute: 0 10*3/uL (ref 0.0–0.1)
Basophils Relative: 0.5 % (ref 0.0–3.0)
Lymphs Abs: 1.5 10*3/uL (ref 0.7–4.0)
MCHC: 32.5 g/dL (ref 30.0–36.0)
MCV: 76.1 fl — ABNORMAL LOW (ref 78.0–100.0)
Monocytes Relative: 6.7 % (ref 3.0–12.0)
Neutro Abs: 4.1 10*3/uL (ref 1.4–7.7)
RDW: 15.9 % — ABNORMAL HIGH (ref 11.5–14.6)
WBC: 6.1 10*3/uL (ref 4.5–10.5)

## 2011-11-27 NOTE — Assessment & Plan Note (Signed)
Discussed how this problem influences overall health and the risks it imposes  Reviewed plan for weight loss with lower calorie diet (via better food choices and also portion control or program like weight watchers) and exercise building up to or more than 30 minutes 5 days per week including some aerobic activity    Doing great- adv to keep up the good work

## 2011-11-27 NOTE — Patient Instructions (Signed)
Labs today for fatigue and thyroid  Let me know if headaches do not improve  Keep up the great work with diet and exercise and weight loss

## 2011-11-27 NOTE — Progress Notes (Signed)
Subjective:    Patient ID: Miranda Gardner, female    DOB: 09/26/1970, 41 y.o.   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 HYPOTHYROIDISM  . HYPERLIPIDEMIA  . ANEMIA, MILD  . DEPRESSION  . ASTHMA, CHILDHOOD  . GERD  . FATIGUE  . Sleep disorder  . Migraine   Past Medical History  Diagnosis Date  . GERD (gastroesophageal reflux disease)   . Hypothyroidism   . Migraine, menstrual   . Anemia     mild  . Kidney stones    Past Surgical History  Procedure Date  . Cholecystectomy    History  Substance Use Topics  . Smoking status: Former Smoker    Quit date: 07/29/1994  . Smokeless tobacco: Not on file  . Alcohol Use: Yes     Rarely   Family History  Problem Relation Age of Onset  . Diabetes Mother   . Anemia Mother     pernicioius   Allergies  Allergen Reactions  . Ciprofloxacin     REACTION: nausea and vomiting  . Erythromycin     REACTION: nausea and vomiting  . Morphine     REACTION: headache   Current Outpatient Prescriptions on File Prior to Visit  Medication Sig Dispense Refill  . clonazePAM (KLONOPIN) 0.5 MG tablet 1-2 by mouth at bedtime for sleep    60 tablet  3  . ibuprofen (ADVIL,MOTRIN) 200 MG tablet Take otc as directed       . levothyroxine (SYNTHROID, LEVOTHROID) 125 MCG tablet TAKE 1 TABLET BY MOUTH EVERY DAY  90 tablet  0  . rizatriptan (MAXALT)  10 MG tablet Take by mouth as directed prn       . sertraline (ZOLOFT) 100 MG tablet TAKE 1 & 1/2 TABLETS BY MOUTH EVERY DAY  135 tablet  1  . topiramate (TOPAMAX) 25 MG tablet Take 25 mg by mouth 2 (two) times daily.      . promethazine (PHENERGAN) 25 MG tablet Take 1 tablet (25 mg total) by mouth every 8 (eight) hours as needed for nausea (with headache).  15 tablet  1  . promethazine (PHENERGAN) 25 MG tablet Take one tablet by mouth every 8 hours as needed for nausea (with headache)  15 tablet  3      Review of Systems Review of Systems  Constitutional: Negative for fever, appetite change,  and unexpected weight change. pos for fatigue  Eyes: Negative for pain and visual disturbance.  Respiratory: Negative for cough and shortness of breath.   Cardiovascular: Negative for cp or palpitations    Gastrointestinal: Negative for nausea, diarrhea and constipation.  Genitourinary: Negative for urgency and frequency.  Skin: Negative for pallor or rash  pos for hair loss  Neurological: Negative for weakness, light-headedness, numbness and headaches.  Hematological: Negative for adenopathy. Does not bruise/bleed easily.  Psychiatric/Behavioral: Negative for dysphoric mood. The patient is not nervous/anxious.         Objective:   Physical Exam  Constitutional: She appears well-developed and well-nourished. No distress.       Obese and well appearing   HENT:  Head: Normocephalic and atraumatic.  Mouth/Throat: Oropharynx is clear and moist.  Eyes: Conjunctivae and EOM are normal. Pupils are equal, round, and reactive to light. No scleral icterus.  Neck: Normal range of motion. Neck supple. No JVD present. Carotid bruit is not present. Erythema present. No thyromegaly present.  Cardiovascular: Normal rate, regular rhythm, normal heart sounds and intact distal pulses.  Exam reveals no gallop.   Pulmonary/Chest: Effort normal and breath sounds normal. No respiratory distress. She has no wheezes.  She exhibits no tenderness.  Abdominal: Soft. Bowel sounds are normal. She exhibits no distension, no abdominal bruit and no mass. There is no tenderness.  Musculoskeletal: Normal range of motion. She exhibits no edema and no tenderness.       No acute joint changes  Lymphadenopathy:    She has no cervical adenopathy.  Neurological: She is alert. She has normal reflexes. No cranial nerve deficit. She exhibits normal muscle tone. Coordination normal.  Skin: Skin is warm and dry. No rash noted. No erythema. No pallor.  Psychiatric: She has a normal mood and affect.          Assessment & Plan:

## 2011-11-27 NOTE — Assessment & Plan Note (Signed)
A bit worse lately-may be due to wt loss  Disc imp water intake - will keep me updated

## 2011-11-27 NOTE — Assessment & Plan Note (Signed)
With sluggishness- and plateau of wt loss and hair loss  Lab today and adv dose

## 2011-11-27 NOTE — Assessment & Plan Note (Signed)
Check thyroid and also cbc  May be from schedule  Urged to continue good habits

## 2011-11-27 NOTE — Assessment & Plan Note (Signed)
Check cbc today Pt is planning partial hyst this year

## 2011-12-17 ENCOUNTER — Other Ambulatory Visit: Payer: Self-pay | Admitting: Family Medicine

## 2011-12-17 NOTE — Telephone Encounter (Signed)
Will refill electronically  

## 2012-01-28 ENCOUNTER — Other Ambulatory Visit: Payer: Self-pay | Admitting: *Deleted

## 2012-01-28 MED ORDER — CLONAZEPAM 0.5 MG PO TABS: ORAL_TABLET | ORAL | Status: DC

## 2012-01-28 NOTE — Telephone Encounter (Signed)
Px written for call in   

## 2012-01-28 NOTE — Telephone Encounter (Signed)
Faxed refill request from [facility]. 

## 2012-01-29 ENCOUNTER — Ambulatory Visit (INDEPENDENT_AMBULATORY_CARE_PROVIDER_SITE_OTHER): Payer: No Typology Code available for payment source | Admitting: Family Medicine

## 2012-01-29 ENCOUNTER — Encounter: Payer: Self-pay | Admitting: Family Medicine

## 2012-01-29 VITALS — BP 110/68 | HR 70 | Temp 98.1°F | Ht 68.0 in | Wt 205.0 lb

## 2012-01-29 DIAGNOSIS — G43909 Migraine, unspecified, not intractable, without status migrainosus: Secondary | ICD-10-CM

## 2012-01-29 MED ORDER — LEVOTHYROXINE SODIUM 125 MCG PO TABS: 125.0000 ug | ORAL_TABLET | Freq: Every day | ORAL | Status: DC

## 2012-01-29 MED ORDER — SERTRALINE HCL 100 MG PO TABS: ORAL_TABLET | ORAL | Status: DC

## 2012-01-29 MED ORDER — PROMETHAZINE HCL 25 MG PO TABS: ORAL_TABLET | ORAL | Status: DC

## 2012-01-29 MED ORDER — TOPIRAMATE 50 MG PO TABS: 50.0000 mg | ORAL_TABLET | Freq: Two times a day (BID) | ORAL | Status: DC

## 2012-01-29 NOTE — Patient Instructions (Addendum)
Continue good lifestyle habits and get back into exercise routine  Increase topamax to 25 mg in am and 50 in pm for 1-2 weeks Then increase topamax to 50 mg twice daily  Drink lots of water Follow up with me in 4-6 weeks  Also when feeling better - cut back on the nsaid

## 2012-01-29 NOTE — Telephone Encounter (Signed)
Rx called in as directed.   

## 2012-01-29 NOTE — Progress Notes (Signed)
Subjective:    Patient ID: Miranda Gardner, female    DOB: October 08, 1970, 40 y.o.   MRN: [id number]  HPI Here for migraines  More often lately   Knows stress is a trigger - got a promotion at work and exercising less (used to go 6 d per week)- just started back  Has not increased caffeine (hot tea in am and then water)  Drinks lots of water  Trying to loose weight  Is getting sleep 7 hours per night - goes to bed and gets up at the same time every day  Is taking advil " like candy" Headache all the time Bad migraine 1-2 times per month ( lasts 1-2 d)- no aura , with n/v   Typical headache - is pain over one side of head - wants to lie down pain scale 2/10   Bad headache 8/10 on scale with vomiting and other symptoms  maxalt only works for the bad headache -- will take 2 in one day 2 hours apart  Takes it down a notch Phenergan really helps   On topamax 50 mg once per day  No side effects  No OC and not sexually active    Patient Active Problem List  Diagnosis  . HYPOTHYROIDISM  . HYPERLIPIDEMIA  . ANEMIA, MILD  . DEPRESSION  . ASTHMA, CHILDHOOD  . GERD  . FATIGUE  . Sleep disorder  . Migraine syndrome  . Fatigue  . Obesity  . Heavy menses   Past Medical History  Diagnosis Date  . GERD (gastroesophageal reflux disease)   . Hypothyroidism   . Migraine, menstrual   . Anemia     mild  . Kidney stones    Past Surgical History  Procedure Date  . Cholecystectomy    History  Substance Use Topics  . Smoking status: Former Smoker    Quit date: 07/29/1994  . Smokeless tobacco: Not on file  . Alcohol Use: Yes     Rarely   Family History  Problem Relation Age of Onset  . Diabetes Mother   . Anemia Mother     pernicioius   Allergies  Allergen Reactions  . Ciprofloxacin     REACTION: nausea and vomiting  . Erythromycin     REACTION: nausea and vomiting  . Morphine     REACTION: headache   Current Outpatient Prescriptions on File Prior to Visit    Medication Sig Dispense Refill  . clonazePAM (KLONOPIN) 0.5 MG tablet 1-2 by mouth at bedtime for sleep  60 tablet  0  . ibuprofen (ADVIL,MOTRIN) 200 MG tablet Take otc as directed       . levothyroxine (SYNTHROID, LEVOTHROID) 125 MCG tablet TAKE 1 TABLET BY MOUTH EVERY DAY  90 tablet  0  . MAXALT 10 MG tablet TAKE 1 TABLET BY MOUTH AS DIRECTED  27 tablet  2  . promethazine (PHENERGAN) 25 MG tablet Take one tablet by mouth every 8 hours as needed for nausea (with headache)  15 tablet  3  . sertraline (ZOLOFT) 100 MG tablet TAKE 1 & 1/2 TABLETS BY MOUTH EVERY DAY  135 tablet  1  . topiramate (TOPAMAX) 25 MG tablet Take 25 mg by mouth 2 (two) times daily.      . promethazine (PHENERGAN) 25 MG tablet Take 1 tablet (25 mg total) by mouth every 8 (eight) hours as needed for nausea (with headache).  15 tablet  1     Review of Systems    Review of Systems  Constitutional: Negative for fever, appetite change, fatigue and unexpected weight change.  Eyes: Negative for pain and visual disturbance.  Respiratory: Negative for cough and shortness of breath.   Cardiovascular: Negative for cp or palpitations    Gastrointestinal: Negative for nausea, diarrhea and constipation.  Genitourinary: Negative for urgency and frequency.  Skin: Negative for pallor or rash   Neurological: Negative for weakness, light-headedness, numbness and pos for headaches , pos for occ tingling in fingers from topamax  Hematological: Negative for adenopathy. Does not bruise/bleed easily.  Psychiatric/Behavioral: Negative for dysphoric mood. The patient is not nervous/anxious.      Objective:   Physical Exam  Constitutional: She appears well-developed and well-nourished. No distress.  HENT:  Head: Normocephalic and atraumatic.  Mouth/Throat: Oropharynx is clear and moist.       No sinus or temple tenderness  Eyes: Conjunctivae and EOM are normal. Pupils are equal, round, and reactive to light. Right eye exhibits no  discharge. Left eye exhibits no discharge. No scleral icterus.  Neck: Normal range of motion. Neck supple. No JVD present. No thyromegaly present.  Cardiovascular: Normal rate, regular rhythm, normal heart sounds and intact distal pulses.  Exam reveals no gallop.   Pulmonary/Chest: Effort normal and breath sounds normal. No respiratory distress. She has no wheezes.  Abdominal: Soft. Bowel sounds are normal. She exhibits no distension and no mass. There is no tenderness.  Musculoskeletal: She exhibits no edema.  Lymphadenopathy:    She has no cervical adenopathy.  Neurological: She is alert. She has normal reflexes. She displays no atrophy and no tremor. No cranial nerve deficit or sensory deficit. She exhibits normal muscle tone. Coordination and gait normal.  Skin: Skin is warm and dry. No rash noted. No erythema. No pallor.  Psychiatric: She has a normal mood and affect.          Assessment & Plan:

## 2012-01-29 NOTE — Assessment & Plan Note (Signed)
Multiple triggers-working on those  Rev lifestyle Suspect that her tension ha are transforming  Will inc topamax gradually to 50 mg bid  F/u 4-6 wk Update if not starting to improve in a week or if worsening   Re assuring neuro exam today

## 2012-04-14 ENCOUNTER — Telehealth: Payer: Self-pay | Admitting: Family Medicine

## 2012-04-14 ENCOUNTER — Encounter: Payer: 59 | Admitting: Family Medicine

## 2012-04-14 DIAGNOSIS — Z0000 Encounter for general adult medical examination without abnormal findings: Secondary | ICD-10-CM | POA: Insufficient documentation

## 2012-04-14 DIAGNOSIS — E785 Hyperlipidemia, unspecified: Secondary | ICD-10-CM

## 2012-04-14 NOTE — Telephone Encounter (Signed)
Message copied by Judy Pimple on Tue Apr 14, 2012  8:49 PM ------      Message from: Baldomero Lamy      Created: Wed Apr 08, 2012 11:07 AM      Regarding: Cpx labs Wed 9/18       Please order  future cpx labs for pt's upcomming lab appt.      Thanks      Rodney Booze

## 2012-04-15 ENCOUNTER — Other Ambulatory Visit: Payer: 59

## 2012-04-21 ENCOUNTER — Telehealth: Payer: Self-pay | Admitting: Family Medicine

## 2012-04-21 MED ORDER — KETOROLAC TROMETHAMINE 10 MG PO TABS: 10.0000 mg | ORAL_TABLET | Freq: Four times a day (QID) | ORAL | Status: DC | PRN

## 2012-04-21 NOTE — Telephone Encounter (Signed)
Left voicemail letting pt know that Rx was called in and to keep appt. As planned but if sxs get worse go to the ER

## 2012-04-21 NOTE — Telephone Encounter (Signed)
Will see her tomorrow as planned- but if symptoms worsen after hours- go to ER Do not mix toradol with other nsaids Keep pushing fluids  Will send toradol electronically

## 2012-04-21 NOTE — Telephone Encounter (Signed)
Caller: [name]/Patient; Phone: [phone]; Reason for Call: Caller: [name]/Patient; Patient Name: Miranda Gardner; PCP: Roxy Manns [facility] Best Callback Phone Number: [phone]  Last Menstrual Cycle 03/29/12.  Patient states she has hisotiry of kidney stones.  Patient states she developed right flank pain, onset 04/20/12 a.  M.  States she has been taking Ibuprofen 800mg .  Twice a day and increasing fluids with no relief.  Patient states she is a Engineer, civil (consulting) and received 1 liter of intravenous fluid 04/21/12 a.  M.  Patient is requesting a prescription for Toradol.  Patient denies hematuria.  Denies pain or burning with urination.  Afebrile.  Triage per Flank Pain Protocol.  No emergent symptoms identified.  Care advice given per guidelines related to positive triage assessment for " History of kidney stones and has similar symptoms now.  " Patient advised increased fluids, strain urine, Ibuprofen 800mg .  Every 8 hours as needed for pain-take with food.  Patient advised appointment within 4 hours.  Patient declines appointment.  Patient states she has an appointment scheduled with Dr.  Milinda Antis on 04/22/12.   PATIENT DECLINES APPOINTMENT for 04/21/12.  PATIENT REQUESTS THAT DR.  Milinda Antis BE NOTIFIED WITH PATIENT REQUEST FOR A PRESCRIPTION FOR TORADOL TO BE CALLED INTO [facility] [address] AT [phone].  PATIENT CAN BE REACHED AT [phone].

## 2012-04-22 ENCOUNTER — Encounter: Payer: Self-pay | Admitting: Family Medicine

## 2012-04-22 ENCOUNTER — Ambulatory Visit (INDEPENDENT_AMBULATORY_CARE_PROVIDER_SITE_OTHER): Payer: No Typology Code available for payment source | Admitting: Family Medicine

## 2012-04-22 VITALS — BP 124/88 | HR 64 | Temp 97.4°F | Ht 67.0 in | Wt 210.8 lb

## 2012-04-22 DIAGNOSIS — Z0000 Encounter for general adult medical examination without abnormal findings: Secondary | ICD-10-CM

## 2012-04-22 DIAGNOSIS — E785 Hyperlipidemia, unspecified: Secondary | ICD-10-CM

## 2012-04-22 DIAGNOSIS — D649 Anemia, unspecified: Secondary | ICD-10-CM

## 2012-04-22 DIAGNOSIS — E669 Obesity, unspecified: Secondary | ICD-10-CM

## 2012-04-22 DIAGNOSIS — N390 Urinary tract infection, site not specified: Secondary | ICD-10-CM | POA: Insufficient documentation

## 2012-04-22 DIAGNOSIS — E039 Hypothyroidism, unspecified: Secondary | ICD-10-CM

## 2012-04-22 DIAGNOSIS — R109 Unspecified abdominal pain: Secondary | ICD-10-CM | POA: Insufficient documentation

## 2012-04-22 DIAGNOSIS — N200 Calculus of kidney: Secondary | ICD-10-CM | POA: Insufficient documentation

## 2012-04-22 LAB — COMPREHENSIVE METABOLIC PANEL
ALT: 37 U/L — ABNORMAL HIGH (ref 0–35)
Albumin: 3.9 g/dL (ref 3.5–5.2)
Alkaline Phosphatase: 56 U/L (ref 39–117)
CO2: 21 mEq/L (ref 19–32)
Potassium: 3.8 mEq/L (ref 3.5–5.1)
Sodium: 134 mEq/L — ABNORMAL LOW (ref 135–145)
Total Bilirubin: 0.8 mg/dL (ref 0.3–1.2)
Total Protein: 7.8 g/dL (ref 6.0–8.3)

## 2012-04-22 LAB — POCT URINALYSIS DIPSTICK
Ketones, UA: NEGATIVE
Protein, UA: NEGATIVE
Urobilinogen, UA: 0.2
pH, UA: 6

## 2012-04-22 LAB — LIPID PANEL
HDL: 58.4 mg/dL (ref >39.00)
VLDL: 14.2 mg/dL (ref 0.0–40.0)

## 2012-04-22 LAB — CBC WITH DIFFERENTIAL/PLATELET
Basophils Absolute: 0 10*3/uL (ref 0.0–0.1)
HCT: 39.2 % (ref 36.0–46.0)
Lymphs Abs: 2.2 10*3/uL (ref 0.7–4.0)
Monocytes Absolute: 0.5 10*3/uL (ref 0.1–1.0)
Monocytes Relative: 7.3 % (ref 3.0–12.0)
Neutrophils Relative %: 59.5 % (ref 43.0–77.0)
Platelets: 209 10*3/uL (ref 150.0–400.0)
RDW: 16.2 % — ABNORMAL HIGH (ref 11.5–14.6)

## 2012-04-22 LAB — LDL CHOLESTEROL, DIRECT: Direct LDL: 139.8 mg/dL

## 2012-04-22 MED ORDER — SULFAMETHOXAZOLE-TRIMETHOPRIM 800-160 MG PO TABS: 1.0000 | ORAL_TABLET | Freq: Two times a day (BID) | ORAL | Status: DC

## 2012-04-22 NOTE — Assessment & Plan Note (Signed)
Suspect kidney stone vs pyelo On seprta Abn ua CT sched Urine cx

## 2012-04-22 NOTE — Progress Notes (Signed)
Subjective:    Patient ID: Miranda Gardner, female    DOB: 03-30-1971, 41 y.o.   MRN: [id number]  HPI Here for health maintenance exam and to review chronic medical problems    Also ? Kidney stone Has had these before  Pain in R flank  Not moving down - since Monday  Pain is generally tolerable -mostly achey and uncomfortable- cannot find a comfortable position Getting through work  Got IV 1 liter of fluid at work  A little more nauseated today No chance pregnant - not sexually active  Last kidney stone was in 2011 - that was last CT scan Does not see a urologist  No blood in urine, no dysuria   Wt is up 5 lb with bmi of 33  Hypothyroid Lab Results  Component Value Date   TSH 1.98 11/27/2011  ' Lipids due  Lab Results  Component Value Date   CHOL 214* 06/15/2010   HDL 46.00 06/15/2010   LDLCALC 124* 08/10/2008   LDLDIRECT 154.4 06/15/2010   TRIG 94.0 06/15/2010   CHOLHDL 5 06/15/2010   ate oatmeal today  Diet-- is pretty good overall  Has not been going to the gym -- too busy with new job  Using herbalife- lost 7 lb in 2 weeks, is on multi vit, and "cell activator" last week  Gyn care- sees Dr Vincente Poli - last appt was may 2012 Is planning for hysterectomy -- heavy bleeding and a cyst   mammo- may 2012 ok  Nl self breast exam  Flu shot- getting it 1 st wkend in oct at work    Review of Systems Review of Systems  Constitutional: Negative for fever, appetite change, fatigue and unexpected weight change.  Eyes: Negative for pain and visual disturbance.  Respiratory: Negative for cough and shortness of breath.   Cardiovascular: Negative for cp or palpitations    Gastrointestinal: Negative for nausea, diarrhea and constipation. pos for R flank pain  Genitourinary: Negative for urgency and frequency.  Skin: Negative for pallor or rash   Neurological: Negative for weakness, light-headedness, numbness and headaches.  Hematological: Negative for adenopathy. Does not  bruise/bleed easily.  Psychiatric/Behavioral: Negative for dysphoric mood. The patient is not nervous/anxious.         Objective:   Physical Exam  Constitutional: She appears well-developed and well-nourished. No distress.       obese and well appearing   HENT:  Head: Normocephalic and atraumatic.  Right Ear: External ear normal.  Left Ear: External ear normal.  Nose: Nose normal.  Mouth/Throat: Oropharynx is clear and moist.  Eyes: Conjunctivae normal and EOM are normal. Pupils are equal, round, and reactive to light. No scleral icterus.  Neck: Normal range of motion. Neck supple. No JVD present. Carotid bruit is not present. No thyromegaly present.  Cardiovascular: Normal rate, regular rhythm, normal heart sounds and intact distal pulses.  Exam reveals no gallop.   Pulmonary/Chest: Effort normal and breath sounds normal. No respiratory distress. She has no wheezes.  Abdominal: Bowel sounds are normal. She exhibits no distension, no abdominal bruit and no mass. There is no hepatosplenomegaly. There is tenderness. There is CVA tenderness. There is no rebound.       Tender in R flank without rebound or gaurding  Musculoskeletal: Normal range of motion. She exhibits no edema and no tenderness.  Lymphadenopathy:    She has no cervical adenopathy.  Neurological: She is alert. She has normal reflexes. No cranial nerve deficit. She exhibits normal muscle  tone. Coordination normal.  Skin: Skin is warm and dry. No rash noted. No erythema. No pallor.       No jaundice   Psychiatric: She has a normal mood and affect.          Assessment & Plan:

## 2012-04-22 NOTE — Assessment & Plan Note (Signed)
Discussed how this problem influences overall health and the risks it imposes  Reviewed plan for weight loss with lower calorie diet (via better food choices and also portion control or program like weight watchers) and exercise building up to or more than 30 minutes 5 days per week including some aerobic activity   Counseled to get back to exercise Urged to use caution with metabolife program/ supplements

## 2012-04-22 NOTE — Assessment & Plan Note (Signed)
Reviewed health habits including diet and exercise and skin cancer prevention Also reviewed health mt list, fam hx and immunizations  Wellness labs today Has separate gyn

## 2012-04-22 NOTE — Assessment & Plan Note (Signed)
Urine cx Septra Fluids  CT - to looks for stone  If neg - could be pyelonephritis  Pend result

## 2012-04-22 NOTE — Assessment & Plan Note (Signed)
From menses For hysterectomy in near future Lab today

## 2012-04-22 NOTE — Assessment & Plan Note (Signed)
tsh today and update  No new symptoms

## 2012-04-22 NOTE — Assessment & Plan Note (Signed)
Possible kidney stone now- R flank pain and nausea Both wbc and rbc in urine  Sent for CT cx urine tx with septra Update  Will seek care asap if symptoms worsen

## 2012-04-22 NOTE — Assessment & Plan Note (Signed)
Lipids today Diet is fair Not as much exercise Rev low sat fat diet

## 2012-04-22 NOTE — Patient Instructions (Addendum)
Labs today  Stop by up front for referral for CT Take the septra DS for likely uti Will culture your urine  Get your flu shot at work  Will update you soon  Urinate through a screen  If pain is worse- please call

## 2012-04-23 ENCOUNTER — Ambulatory Visit: Payer: Self-pay | Admitting: Family Medicine

## 2012-04-23 LAB — POCT UA - MICROSCOPIC ONLY
Casts, Ur, LPF, POC: 0
Yeast, UA: 0

## 2012-04-24 ENCOUNTER — Encounter: Payer: Self-pay | Admitting: Family Medicine

## 2012-04-24 LAB — URINE CULTURE: Colony Count: 1000

## 2012-04-26 ENCOUNTER — Encounter: Payer: Self-pay | Admitting: Family Medicine

## 2012-09-12 ENCOUNTER — Other Ambulatory Visit: Payer: Self-pay

## 2013-01-30 ENCOUNTER — Other Ambulatory Visit: Payer: Self-pay | Admitting: Family Medicine

## 2013-02-01 NOTE — Telephone Encounter (Signed)
Please schedule PE for nov and refill until then, thanks

## 2013-02-01 NOTE — Telephone Encounter (Signed)
Electronic refill request, please advise  

## 2013-02-03 NOTE — Telephone Encounter (Signed)
Left voicemail requesting pt to call office 

## 2013-02-03 NOTE — Telephone Encounter (Signed)
CPE scheduled in Nov and meds refilled

## 2013-02-16 ENCOUNTER — Telehealth: Payer: Self-pay

## 2013-02-16 NOTE — Telephone Encounter (Signed)
Please ask her if she has taken any other tryptans (imitrex/ zomig/relpax /amerge/ axert)- thanks - I will hold form on my desk

## 2013-02-16 NOTE — Telephone Encounter (Signed)
Prior auth needed for Rizatriptan; form on Dr Royden Purl shelf.

## 2013-02-16 NOTE — Telephone Encounter (Signed)
Left voicemail requesting pt to call office 

## 2013-02-22 NOTE — Telephone Encounter (Signed)
Left voicemail (on cell #, home # off) requesting pt to call office

## 2013-03-04 NOTE — Telephone Encounter (Signed)
Left 3rd voicemail and letter mailed to pt requesting her to call office

## 2013-03-09 NOTE — Telephone Encounter (Signed)
Thanks for the heads up -will continue to follow  I will shred prior auth

## 2013-03-09 NOTE — Telephone Encounter (Signed)
Pt want's to hold off on doing a PA because she has enough maxalt for now, and she is about to [employer], pt said we don't have to do PA and once she changes insurances she will check with the pharmacy to see if her new insurance will require a PA or not but for now she has enough medication to last a while. But she hasn't tried any other tryptans meds in the past

## 2013-04-14 IMAGING — CT CT ABD-PELV W/O CM
1 of 2 series · 15 of 32 positions shown, 19 images · non-contrast
Comparison: none

REASON FOR EXAM: Hematuria Rt Flank Pain Hx Stones
COMMENTS:

PROCEDURE:     CT  - CT ABDOMEN AND PELVIS W[DATE]  [DATE]
RESULT:
TECHNIQUE: Helical noncontrasted 3 mm sections were obtained from the lung
bases through the pubic symphysis. Comparison is made to a prior study dated
10/14/2009.

[Series 2: 3mm soft tissue · axial · 0.68mm/px · z∈[-1096,-646]mm · 15 of 164 slices shown, 19 images]
[im 7/164  soft-tissue]
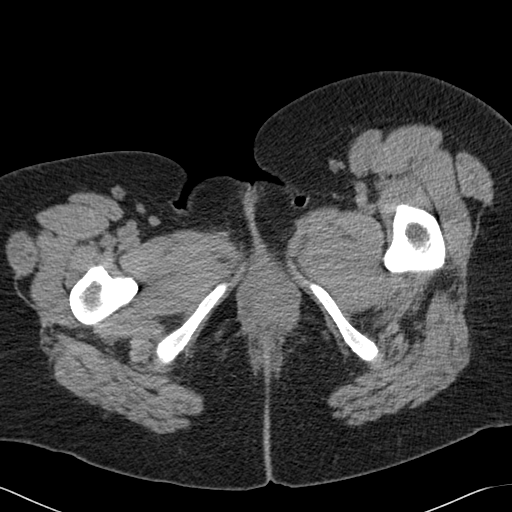
[im 7/164  bone]
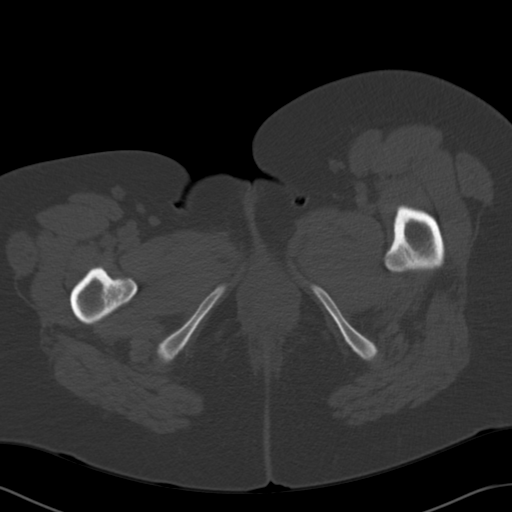
[im 21/164  soft-tissue]
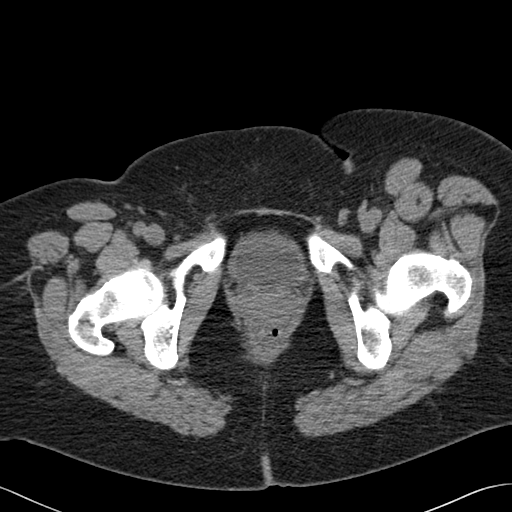
[im 34/164  soft-tissue]
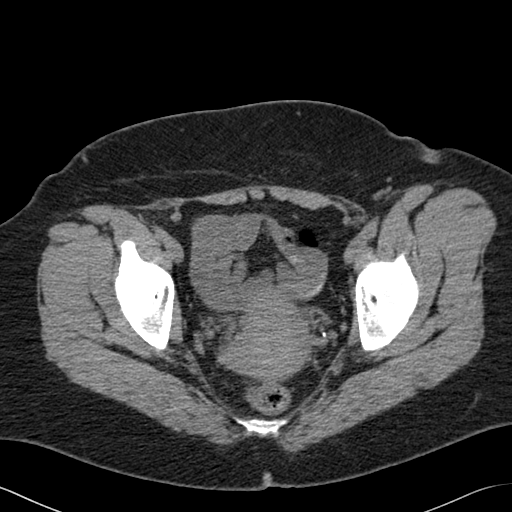
[im 48/164  soft-tissue]
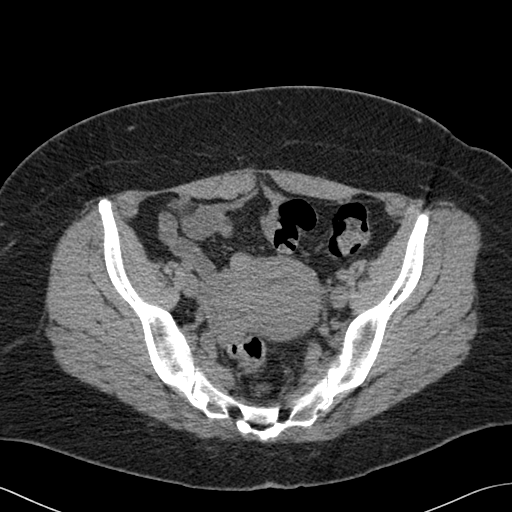
[im 55/164  soft-tissue]
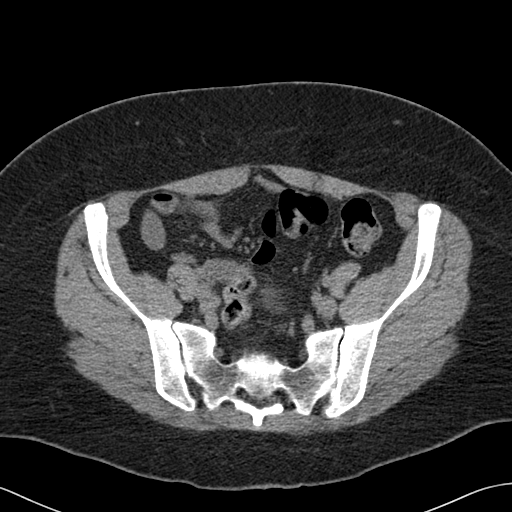
[im 68/164  soft-tissue]
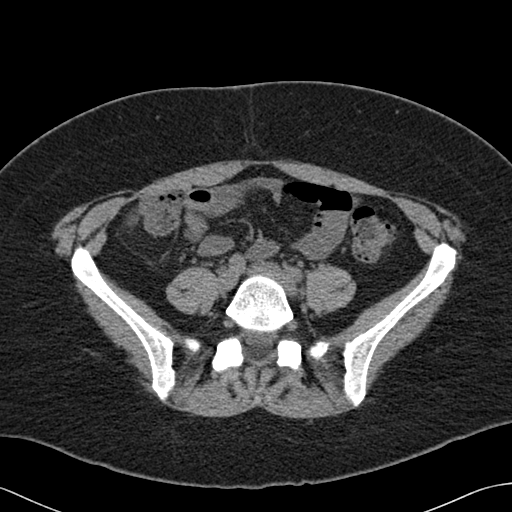
[im 82/164  soft-tissue]
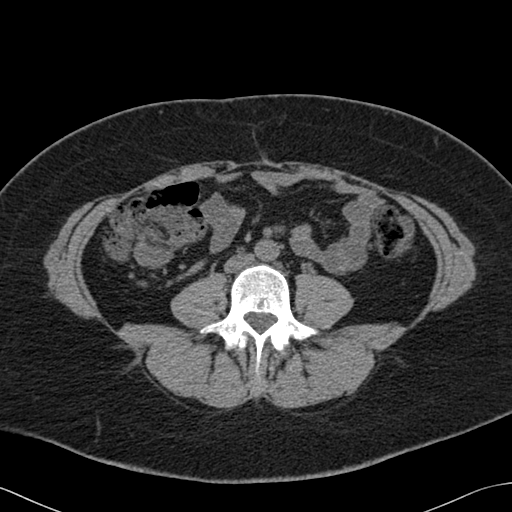
[im 96/164  soft-tissue]
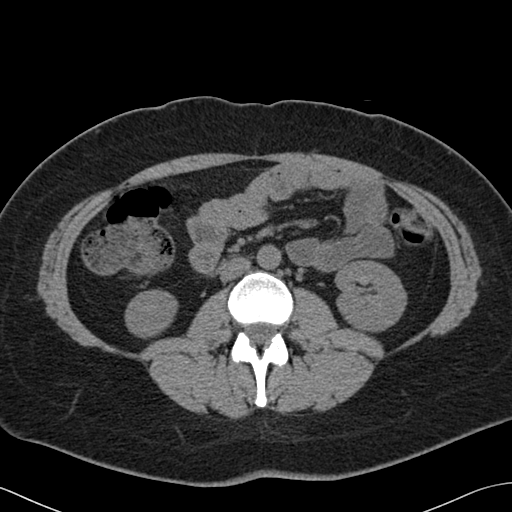
[im 109/164  soft-tissue]
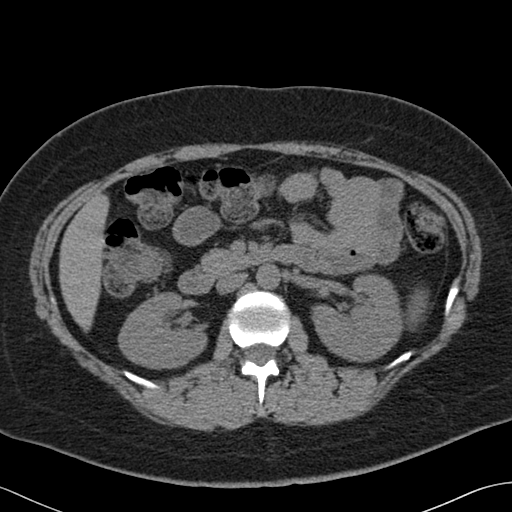
[im 109/164  bone]
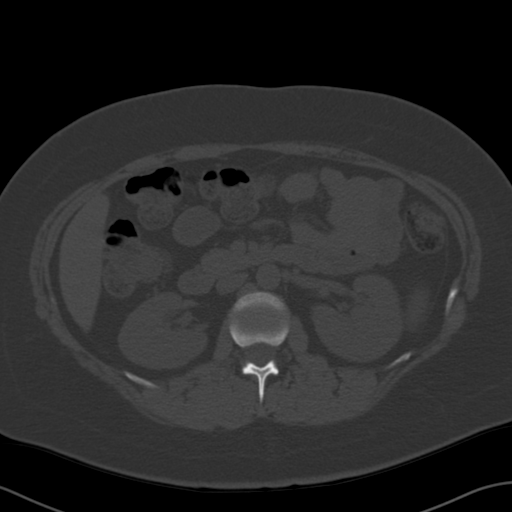
[im 116/164  soft-tissue]
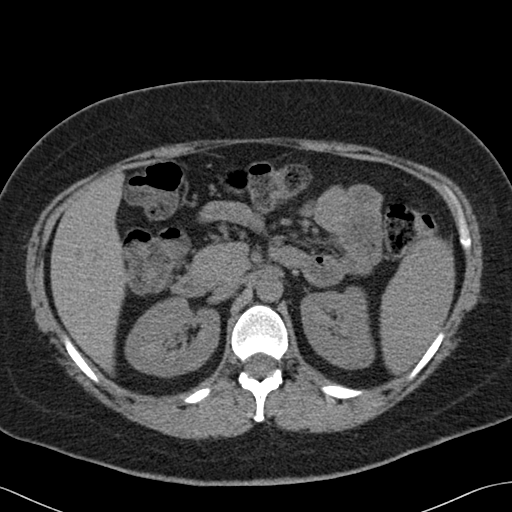
[im 130/164  soft-tissue]
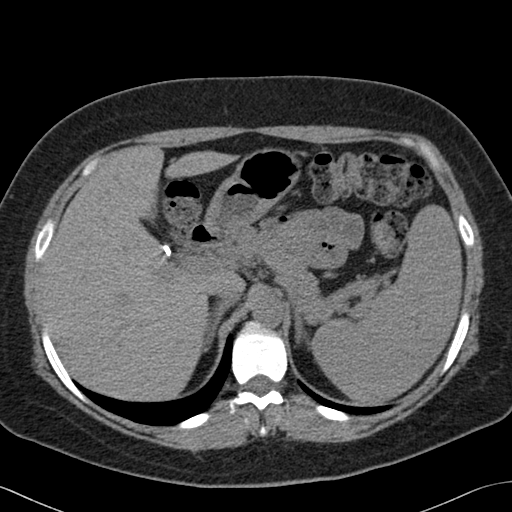
[im 136/164  lung]
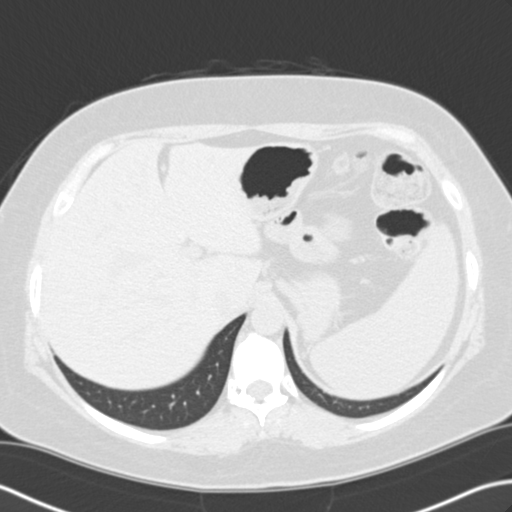
[im 143/164  soft-tissue]
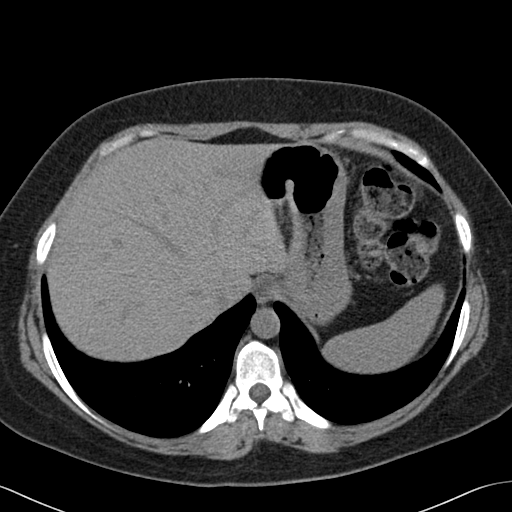
[im 143/164  lung]
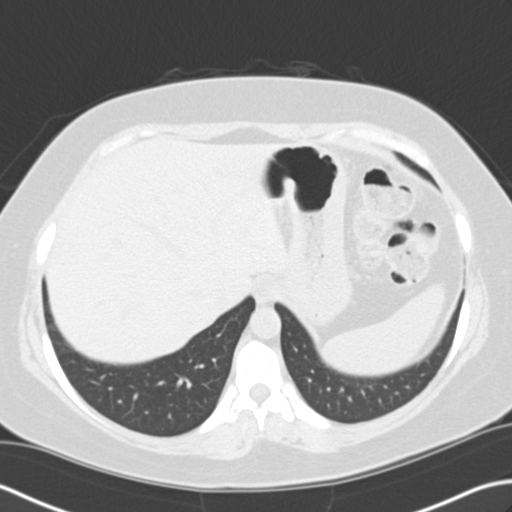
[im 150/164  lung]
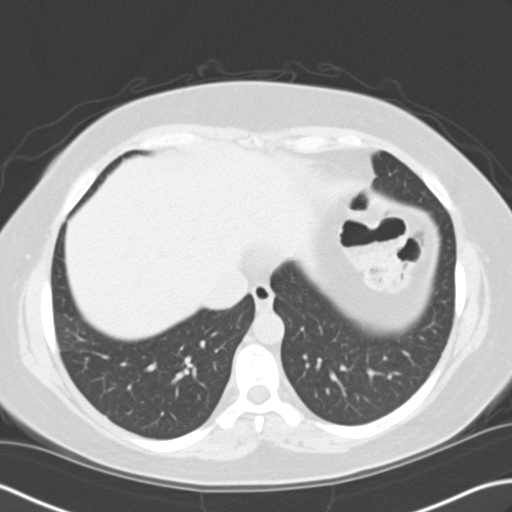
[im 157/164  soft-tissue]
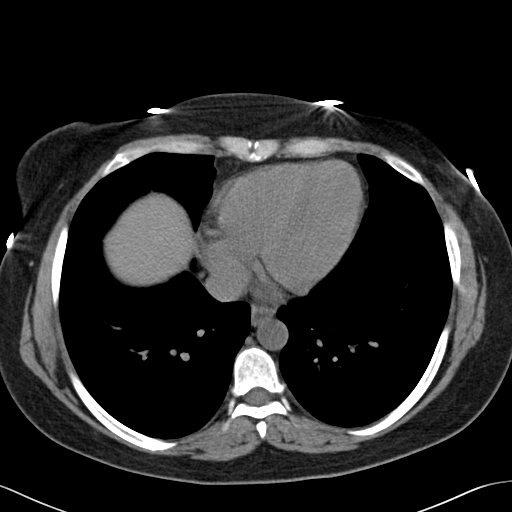
[im 157/164  lung]
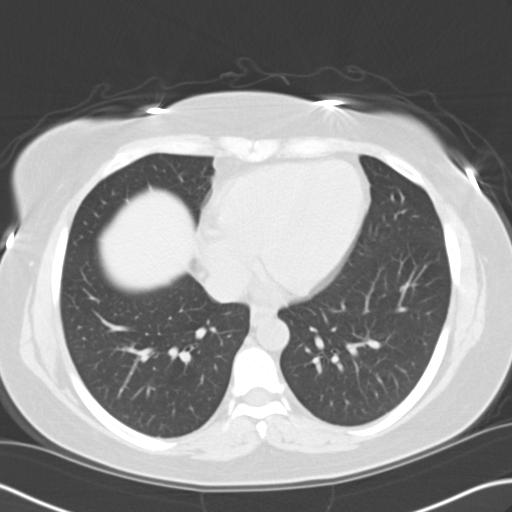

[15 of 32 positions shown; findings below may reference images not displayed]

FINDINGS: The lung bases are unremarkable.

The liver, spleen, adrenals, and pancreas are unremarkable. Evaluation of
the kidneys demonstrates no evidence of hydronephrosis, hydroureter,
nephrolithiasis, nor ureterolithiasis. Within the limitations of a
noncontrast CT, there is no evidence of bowel obstruction, enteritis,
colitis, diverticulitis, nor appendicitis. The patient is status post
cholecystectomy. There is no evidence of an abdominal aortic aneurysm.
Within the limitations of a noncontrasted CT, no evidence of drainable
loculated fluid collections, masses or adenopathy is appreciated. A small
amount of free fluid is appreciated in the region of the cul-de-sac likely
physiologic.
IMPRESSION: No CT evidence of obstructive or inflammatory abnormalities.

## 2013-04-20 ENCOUNTER — Ambulatory Visit (INDEPENDENT_AMBULATORY_CARE_PROVIDER_SITE_OTHER): Payer: BC Managed Care – PPO | Admitting: Family Medicine

## 2013-04-20 ENCOUNTER — Encounter: Payer: Self-pay | Admitting: Family Medicine

## 2013-04-20 VITALS — BP 116/68 | HR 80 | Temp 98.1°F | Ht 67.0 in | Wt 223.0 lb

## 2013-04-20 DIAGNOSIS — G43109 Migraine with aura, not intractable, without status migrainosus: Secondary | ICD-10-CM

## 2013-04-20 DIAGNOSIS — E039 Hypothyroidism, unspecified: Secondary | ICD-10-CM

## 2013-04-20 DIAGNOSIS — G43909 Migraine, unspecified, not intractable, without status migrainosus: Secondary | ICD-10-CM

## 2013-04-20 MED ORDER — TOPIRAMATE 100 MG PO TABS: 100.0000 mg | ORAL_TABLET | Freq: Two times a day (BID) | ORAL | Status: DC

## 2013-04-20 MED ORDER — SERTRALINE HCL 100 MG PO TABS: ORAL_TABLET | ORAL | Status: DC

## 2013-04-20 MED ORDER — RIZATRIPTAN BENZOATE 10 MG PO TABS: 10.0000 mg | ORAL_TABLET | ORAL | Status: DC | PRN

## 2013-04-20 MED ORDER — PROMETHAZINE HCL 25 MG PO TABS: ORAL_TABLET | ORAL | Status: DC

## 2013-04-20 NOTE — Progress Notes (Signed)
Subjective:    Patient ID: Miranda Gardner, female    DOB: 1971-05-09, 42 y.o.   MRN: [id number]  HPI Here for f/u of migraines   Wt is up 13 lb   Headaches have become worse lately  More frequent (was a few a mo)- and now she is getting them at least every other week  Taking a lot of advil (? 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 Kidney stones 04/22/2012  . UTI (lower urinary tract infection) 04/22/2012  . Right flank pain 04/22/2012  . Routine general medical examination at a health care facility 04/14/2012  . Fatigue 11/27/2011  . Obesity 11/27/2011  . Heavy menses 11/27/2011  . Sleep disorder 12/07/2010  . Migraine syndrome 12/07/2010  . HYPERLIPIDEMIA 12/28/2008  . ANEMIA, MILD 08/10/2008  . FATIGUE 12/15/2007  . HYPOTHYROIDISM 09/10/2007  . DEPRESSION 09/10/2007  . ASTHMA, CHILDHOOD 09/10/2007  . GERD 09/10/2007    Past Medical History  Diagnosis Date  . GERD (gastroesophageal reflux disease)   . Hypothyroidism   . Migraine, menstrual   . Anemia     mild  . Kidney stones    Past Surgical History  Procedure Laterality Date  . Cholecystectomy     History  Substance Use Topics  . Smoking status: Former Smoker    Quit date: 07/29/1994  . Smokeless tobacco: Not on file  . Alcohol Use: Yes     Comment: Rarely   Family History  Problem Relation Age of Onset  . Diabetes Mother   . Anemia Mother     pernicioius   Allergies  Allergen Reactions  . Ciprofloxacin     REACTION: nausea and vomiting  . Erythromycin     REACTION: nausea and vomiting  . Morphine     REACTION: headache   Current Outpatient Prescriptions on File Prior to Visit  Medication Sig Dispense Refill  . ketorolac (TORADOL) 10 MG tablet Take 1 tablet (10 mg total) by mouth every 6 (six) hours as needed for pain (take with food ).  20 tablet  0  . levothyroxine (SYNTHROID, LEVOTHROID) 125 MCG tablet TAKE 1 TABLET BY MOUTH DAILY.  90 tablet  1  . promethazine (PHENERGAN) 25 MG tablet Take  one tablet by mouth every 8 hours as needed for nausea (with headache)  15 tablet  3  . rizatriptan (MAXALT) 10 MG tablet TAKE 1 TABLET BY MOUTH AS DIRECTED  27 tablet  1  . sertraline (ZOLOFT) 100 MG tablet TAKE 1 & 1/2 TABLETS BY MOUTH ONCE DAILY  135 tablet  1  . topiramate (TOPAMAX) 50 MG tablet TAKE 1 TABLET BY MOUTH 2 TIMES DAILY.  60 tablet  3   No current facility-administered medications on file prior to visit.        Review of Systems Review of Systems  Constitutional: Negative for fever, appetite change, fatigue and unexpected weight change.  Eyes: Negative for pain and visual disturbance.  Respiratory: Negative for cough and shortness of breath.   Cardiovascular: Negative for cp or palpitations    Gastrointestinal: Negative for nausea, diarrhea and constipation.  Genitourinary: Negative for urgency and frequency.   Skin: Negative for pallor or rash   Neurological: Negative for weakness, light-headedness, numbness and pos for ha  Hematological: Negative for adenopathy. Does not bruise/bleed easily.  Psychiatric/Behavioral: Negative for dysphoric mood. The patient is not nervous/anxious.         Objective:   Physical Exam  Constitutional: She is oriented to person, place, and time. She appears well-developed and well-nourished. No distress.  obese and well appearing   HENT:  Head: Normocephalic and atraumatic.  Right Ear: External ear normal.  Left Ear: External ear normal.  Nose: Nose normal.  Mouth/Throat: Oropharynx is clear and moist.  Eyes: Conjunctivae and EOM are normal. Pupils are equal, round, and reactive to light. Right eye exhibits no discharge. Left eye exhibits no discharge. No scleral icterus.  Neck: Normal range of motion. Neck supple. No JVD present. Carotid bruit is not present. No thyromegaly present.  Cardiovascular: Normal rate, regular rhythm, normal heart sounds and intact distal pulses.  Exam reveals no gallop.   Pulmonary/Chest: Effort normal and breath sounds normal. No respiratory distress. She has no wheezes.  Abdominal: She exhibits no abdominal bruit.  Musculoskeletal: She exhibits no edema and no tenderness.  Lymphadenopathy:    She has no cervical adenopathy.  Neurological: She is alert and oriented to person, place, and time. She has normal strength and normal reflexes. She displays no atrophy and no tremor. No cranial nerve deficit. She exhibits normal muscle tone. Coordination and gait normal.  Skin: Skin is warm and dry. No rash noted. No erythema. No pallor.  Psychiatric: She has a normal mood and affect.          Assessment & Plan:

## 2013-04-20 NOTE — Patient Instructions (Addendum)
For topamax -increase to 50 in am and 100 mg in pm for 2 weeks and then if well tolerated increase to 100 mg twice daily If significant side effects let me know as well  Lab for thyroid today  Schedule physical in winter or spring when you are ready

## 2013-04-21 NOTE — Assessment & Plan Note (Signed)
tsh today No clinical change except for wt gain

## 2013-04-21 NOTE — Assessment & Plan Note (Signed)
Long disc about ha pattern- worse poss due to schedule change and wt gain  Gradual inc of topamax to 100 bid as tolerated  Lifestyle change  Ha diary F/u planned >25 min spent with face to face with patient, >50% counseling and/or coordinating care

## 2013-05-25 ENCOUNTER — Other Ambulatory Visit: Payer: Self-pay | Admitting: *Deleted

## 2013-05-25 MED ORDER — LEVOTHYROXINE SODIUM 125 MCG PO TABS: ORAL_TABLET | ORAL | Status: DC

## 2013-05-25 NOTE — Telephone Encounter (Signed)
Pt request 90 day supply sent to [facility]

## 2013-06-01 ENCOUNTER — Encounter: Payer: No Typology Code available for payment source | Admitting: Family Medicine

## 2013-06-03 ENCOUNTER — Other Ambulatory Visit: Payer: Self-pay

## 2013-08-25 ENCOUNTER — Encounter: Payer: Commercial Managed Care - PPO | Admitting: Family Medicine

## 2013-12-17 ENCOUNTER — Ambulatory Visit (INDEPENDENT_AMBULATORY_CARE_PROVIDER_SITE_OTHER): Payer: Commercial Managed Care - PPO | Admitting: Family Medicine

## 2013-12-17 ENCOUNTER — Encounter: Payer: Self-pay | Admitting: Family Medicine

## 2013-12-17 VITALS — BP 102/66 | HR 67 | Temp 98.0°F | Ht 67.0 in | Wt 225.5 lb

## 2013-12-17 DIAGNOSIS — G43109 Migraine with aura, not intractable, without status migrainosus: Secondary | ICD-10-CM

## 2013-12-17 DIAGNOSIS — G43909 Migraine, unspecified, not intractable, without status migrainosus: Secondary | ICD-10-CM

## 2013-12-17 MED ORDER — PROMETHAZINE HCL 25 MG PO TABS: ORAL_TABLET | ORAL | Status: DC

## 2013-12-17 MED ORDER — KETOROLAC TROMETHAMINE 10 MG PO TABS: 10.0000 mg | ORAL_TABLET | Freq: Four times a day (QID) | ORAL | Status: DC | PRN

## 2013-12-17 MED ORDER — GABAPENTIN 100 MG PO CAPS: 100.0000 mg | ORAL_CAPSULE | Freq: Two times a day (BID) | ORAL | Status: DC

## 2013-12-17 NOTE — Progress Notes (Signed)
Pre visit review using our clinic review tool, if applicable. No additional management support is needed unless otherwise documented below in the visit note. 

## 2013-12-17 NOTE — Progress Notes (Signed)
Subjective:    Patient ID: Miranda Gardner, female    DOB: 1971-04-13, 43 y.o.   MRN: [id number]  HPI Here for migraine  This one is since Tuesday- and she has never had one this long before  She has taken phenergan -improved a bit (but still there) Also maxalt  Last nsaid was this am around 7 (4 ibuprofen)  Is out of toradol   Sensitive to light and sound  This week had vomiting /usually just nausea   Started her peroid yesterday  Still terrible peroid  Needs a hysterectomy  Cannot get the time off   Has not taken good care of herself  Does sleep 6-7 hours per night -not enough (but able to sleep)  Caffeine -still off coffee  Drinks water    Dry nose  Some sinus issues    Having a severe one today   Went up on topamax at last visit Did help her headaches for a while   She has word searching and memory issues with this and also finger tingling   Patient Active Problem List   Diagnosis Date Noted  . Kidney stones 04/22/2012  . UTI (lower urinary tract infection) 04/22/2012  . Right flank pain 04/22/2012  . Routine general medical examination at a health care facility 04/14/2012  . Fatigue 11/27/2011  . Obesity 11/27/2011  . Heavy menses 11/27/2011  . Sleep disorder 12/07/2010  . Migraine syndrome 12/07/2010  . HYPERLIPIDEMIA 12/28/2008  . ANEMIA, MILD 08/10/2008  . FATIGUE 12/15/2007  . HYPOTHYROIDISM 09/10/2007  . DEPRESSION 09/10/2007  . ASTHMA, CHILDHOOD 09/10/2007  . GERD 09/10/2007   Past Medical History  Diagnosis Date  . GERD (gastroesophageal reflux disease)   . Hypothyroidism   . Migraine, menstrual   . Anemia     mild  . Kidney stones    Past Surgical History  Procedure Laterality Date  . Cholecystectomy     History  Substance Use Topics  . Smoking status: Former Smoker    Quit date: 07/29/1994  . Smokeless tobacco: Not on file  . Alcohol Use: Yes     Comment: Rarely   Family History  Problem Relation Age of Onset  . Diabetes  Mother   . Anemia Mother     pernicioius   Allergies  Allergen Reactions  . Ciprofloxacin     REACTION: nausea and vomiting  . Erythromycin     REACTION: nausea and vomiting  . Morphine     REACTION: headache   Current Outpatient Prescriptions on File Prior to Visit  Medication Sig Dispense Refill  . ketorolac (TORADOL) 10 MG tablet Take 1 tablet (10 mg total) by mouth every 6 (six) hours as needed for pain (take with food ).  20 tablet  0  . levothyroxine (SYNTHROID, LEVOTHROID) 125 MCG tablet TAKE 1 TABLET BY MOUTH DAILY.  90 tablet  1  . promethazine (PHENERGAN) 25 MG tablet Take one tablet by mouth every 8 hours as needed for nausea (with headache)  15 tablet  3  . rizatriptan (MAXALT) 10 MG tablet Take 1 tablet (10 mg total) by mouth as needed for migraine. May repeat in 2 hours if needed  27 tablet  3  . sertraline (ZOLOFT) 100 MG tablet Take 1 1/2 tablets by mouth once daily  135 tablet  3  . topiramate (TOPAMAX) 100 MG tablet Take 1 tablet (100 mg total) by mouth 2 (two) times daily.  180 tablet  3   No current facility-administered  medications on file prior to visit.    Review of Systems    Review of Systems  Constitutional: Negative for fever, appetite change, and unexpected weight change.  Eyes: Negative for pain and visual disturbance.  Respiratory: Negative for cough and shortness of breath.   Cardiovascular: Negative for cp or palpitations    Gastrointestinal: Negative for nausea, diarrhea and constipation.  Genitourinary: Negative for urgency and frequency.  Skin: Negative for pallor or rash   Neurological: Negative for weakness, light-headedness, numbness and pos for headaches.  Hematological: Negative for adenopathy. Does not bruise/bleed easily.  Psychiatric/Behavioral: Negative for dysphoric mood. The patient is not nervous/anxious.  pos for mind fog and word searching     Objective:   Physical Exam  Constitutional: She appears well-developed and  well-nourished. No distress.  obese and well appearing   HENT:  Head: Normocephalic and atraumatic.  Right Ear: External ear normal.  Left Ear: External ear normal.  Nose: Nose normal.  Mouth/Throat: Oropharynx is clear and moist.  No sinus or temporal tenderness   Eyes: Conjunctivae and EOM are normal. Pupils are equal, round, and reactive to light. Right eye exhibits no discharge. Left eye exhibits no discharge. No scleral icterus.  Neck: Normal range of motion. Neck supple. No JVD present. Carotid bruit is not present. No thyromegaly present.  Cardiovascular: Normal rate, regular rhythm, normal heart sounds and intact distal pulses.  Exam reveals no gallop.   No murmur heard. Pulmonary/Chest: Effort normal and breath sounds normal. No respiratory distress. She has no wheezes.  Musculoskeletal: She exhibits no edema.  No acute joint changes   Lymphadenopathy:    She has no cervical adenopathy.  Neurological: She is alert. She has normal reflexes. She displays no atrophy and no tremor. No cranial nerve deficit or sensory deficit. She exhibits normal muscle tone. She displays no seizure activity. Coordination and gait normal.  No cerebellar signs   Skin: Skin is warm and dry. No rash noted. No erythema. No pallor.  Psychiatric: She has a normal mood and affect.          Assessment & Plan:

## 2013-12-17 NOTE — Patient Instructions (Signed)
Toradol refilled  Take that and phenergan as needed Stop at check out for neurology referral -for headaches  If worse let me know

## 2013-12-19 NOTE — Assessment & Plan Note (Signed)
Daily migraine -recently with severe one during menses  topamax is causing side eff and not working as well as previous Will work towards weaning that and also trial of gabapentin- disc poss side eff- low dose and titrate Also ref to neuro for headache eval  Disc lifestyle habits

## 2013-12-24 ENCOUNTER — Ambulatory Visit: Payer: BC Managed Care – PPO | Admitting: Family Medicine

## 2013-12-29 ENCOUNTER — Encounter: Payer: BC Managed Care – PPO | Admitting: Family Medicine

## 2014-01-10 ENCOUNTER — Other Ambulatory Visit: Payer: Self-pay | Admitting: Family Medicine

## 2014-03-04 ENCOUNTER — Encounter: Payer: Commercial Managed Care - PPO | Admitting: Family Medicine

## 2014-03-28 ENCOUNTER — Telehealth: Payer: Self-pay | Admitting: *Deleted

## 2014-03-28 NOTE — Telephone Encounter (Signed)
Pt notified of Dr. Tower's comments/recommendations and verbalized understanding  

## 2014-03-28 NOTE — Telephone Encounter (Signed)
Patient's mother just diagnosed with C-Diff (Dr. Synthia Innocent patient). Mother lives with her. Does she need prophylactic treatment or just wait and see if she develops symptoms? Patient asked that I send you a message.

## 2014-03-28 NOTE — Telephone Encounter (Signed)
Left voicemail requesting pt to call office back 

## 2014-03-28 NOTE — Telephone Encounter (Signed)
We would only treat her if she develops symptoms  Wash hands well/ clean surfaces

## 2014-04-27 ENCOUNTER — Encounter: Payer: Commercial Managed Care - PPO | Admitting: Family Medicine

## 2014-05-17 ENCOUNTER — Telehealth: Payer: Self-pay | Admitting: Family Medicine

## 2014-05-17 NOTE — Telephone Encounter (Signed)
Cannot treat her over the phone - without evaluation  Perhaps there are after hours appts this week she could get child care for?

## 2014-05-17 NOTE — Telephone Encounter (Signed)
Pt notified of Dr. Tower's comments  

## 2014-05-17 NOTE — Telephone Encounter (Signed)
Caller: Miranda Gardner/Patient; Phone: [phone]; Reason for Call: Mother reports her daughter saw Dr.  Lorelei Pont on 05/16/14.  Reports daughter was diagnosed with bronchitis and given antibiotics.  Mother has since also developed symptoms and would like to know if there is any way the provider can call in something for her as well.  She does not feel comfortable leaving her daughter at home in order to come into the office for an appointment.  Reports she is having wheezing, pain when coughing deeply, runny nose, nasal congestion.  No difficulty breathing, no dizziness.  Please contact her to give further instructions.

## 2014-06-14 ENCOUNTER — Encounter: Payer: Commercial Managed Care - PPO | Admitting: Family Medicine

## 2014-07-13 ENCOUNTER — Telehealth: Payer: Self-pay

## 2014-07-13 ENCOUNTER — Encounter: Payer: Commercial Managed Care - PPO | Admitting: Family Medicine

## 2014-07-13 NOTE — Telephone Encounter (Signed)
Please schedule tsh for hypothyroidism Thanks

## 2014-07-13 NOTE — Telephone Encounter (Signed)
Pt left v/m; due to work conflict pt cancelled CPX with labs today. Pt wants to know if could get labs ordered at [facility] lab for thyroid testing prior to rescheduled CPX 02/13/2015. Pt has been out of levothyroxine.pt request cb.

## 2014-07-14 MED ORDER — LEVOTHYROXINE SODIUM 125 MCG PO TABS: 125.0000 ug | ORAL_TABLET | Freq: Every day | ORAL | Status: DC

## 2014-07-14 NOTE — Telephone Encounter (Signed)
Rx sent to pharmacy and lab appt scheduled  

## 2014-08-24 ENCOUNTER — Other Ambulatory Visit: Payer: Self-pay | Admitting: Family Medicine

## 2014-08-24 DIAGNOSIS — E038 Other specified hypothyroidism: Secondary | ICD-10-CM

## 2014-08-24 NOTE — Telephone Encounter (Signed)
Ok to refill? appt schedule on 08/29/2014.

## 2014-08-24 NOTE — Telephone Encounter (Signed)
Please refill times one Thanks

## 2014-08-28 ENCOUNTER — Telehealth: Payer: Self-pay | Admitting: Family Medicine

## 2014-08-28 DIAGNOSIS — E785 Hyperlipidemia, unspecified: Secondary | ICD-10-CM

## 2014-08-28 NOTE — Telephone Encounter (Signed)
-----   Message from Ellamae Sia sent at 08/24/2014  3:00 PM EST ----- Regarding: Lab orders for Monday, 2.1.16 Pt is coming in for TSH, wants to add Lipid. Thanks, T

## 2014-08-29 ENCOUNTER — Other Ambulatory Visit: Payer: Commercial Managed Care - PPO

## 2014-09-05 ENCOUNTER — Other Ambulatory Visit (INDEPENDENT_AMBULATORY_CARE_PROVIDER_SITE_OTHER): Payer: Commercial Managed Care - PPO

## 2014-09-05 DIAGNOSIS — E038 Other specified hypothyroidism: Secondary | ICD-10-CM

## 2014-09-05 DIAGNOSIS — E785 Hyperlipidemia, unspecified: Secondary | ICD-10-CM

## 2014-09-05 LAB — LIPID PANEL
Cholesterol: 197 mg/dL (ref 0–200)
HDL: 50.6 mg/dL (ref >39.00)
LDL Cholesterol: 121 mg/dL — ABNORMAL HIGH (ref 0–99)
NonHDL: 146.4
Total CHOL/HDL Ratio: 4
Triglycerides: 128 mg/dL (ref 0.0–149.0)
VLDL: 25.6 mg/dL (ref 0.0–40.0)

## 2014-09-05 LAB — TSH: TSH: 5.94 u[IU]/mL — ABNORMAL HIGH (ref 0.35–4.50)

## 2014-09-05 LAB — T3, FREE: T3, Free: 3.2 pg/mL (ref 2.3–4.2)

## 2014-09-05 LAB — T4, FREE: FREE T4: 0.57 ng/dL — AB (ref 0.60–1.60)

## 2014-09-05 NOTE — Addendum Note (Signed)
Addended by: Marchia Bond on: 09/05/2014 08:41 AM   Modules accepted: Orders

## 2014-09-06 ENCOUNTER — Other Ambulatory Visit: Payer: Commercial Managed Care - PPO

## 2014-11-07 ENCOUNTER — Other Ambulatory Visit: Payer: Self-pay | Admitting: Family Medicine

## 2014-11-07 ENCOUNTER — Ambulatory Visit (INDEPENDENT_AMBULATORY_CARE_PROVIDER_SITE_OTHER): Payer: Commercial Managed Care - PPO | Admitting: Family Medicine

## 2014-11-07 ENCOUNTER — Encounter: Payer: Self-pay | Admitting: Family Medicine

## 2014-11-07 VITALS — BP 118/80 | HR 70 | Temp 98.2°F | Ht 67.0 in | Wt 223.1 lb

## 2014-11-07 DIAGNOSIS — E039 Hypothyroidism, unspecified: Secondary | ICD-10-CM | POA: Diagnosis not present

## 2014-11-07 DIAGNOSIS — G43909 Migraine, unspecified, not intractable, without status migrainosus: Secondary | ICD-10-CM

## 2014-11-07 DIAGNOSIS — G43109 Migraine with aura, not intractable, without status migrainosus: Secondary | ICD-10-CM | POA: Diagnosis not present

## 2014-11-07 MED ORDER — NORTRIPTYLINE HCL 10 MG PO CAPS: 10.0000 mg | ORAL_CAPSULE | Freq: Every day | ORAL | Status: DC

## 2014-11-07 MED ORDER — LEVOTHYROXINE SODIUM 150 MCG PO TABS: 150.0000 ug | ORAL_TABLET | Freq: Every day | ORAL | Status: DC

## 2014-11-07 NOTE — Assessment & Plan Note (Signed)
Pt did not check mychart for last results tsh high/ Free T4 slt low Inc levothyroxine to 150 mcg Lab 6 wk  States hair is falling out a bit

## 2014-11-07 NOTE — Assessment & Plan Note (Signed)
Worse lately Only tol topamax once daily  Also not sleeping  Much stress-looking for new job Try nortriptyline - (Discussed expectations of this medication including time to effectiveness and mechanism of action, also poss of side effects (early and late)- including mental fuzziness, weight or appetite change, nausea and poss of worse dep or anxiety (even suicidal thoughts)  Pt voiced understanding and will stop med and update if this occurs   Hope will help with sleep and HA- can titrate up  Also ref to [facility] [facility] neuro

## 2014-11-07 NOTE — Patient Instructions (Addendum)
Continue current medicines  Try to work on lifestyle change for headache  Start nortriptyline 10 mg at bedtime -for sleep and headache prevention  We can titrate the dose up - let me know how you are doing in about 2 weeks  Stop at check out for referral to neuro  Increase thyroid dose to 150 Schedule non fasting lab for 6 weeks for tsh

## 2014-11-07 NOTE — Progress Notes (Signed)
Subjective:    Patient ID: Miranda Gardner, female    DOB: 1970-08-20, 44 y.o.   MRN: [id number]  HPI Here for migraines and hypothyroidism  Since last visit she had to decrease topamax due to mental fogginess  Gabapentin did not work and made her forgetful   Much stress at work - making it worse   Her insurance does not cover any neuro except [facility] -and [facility] neuro will not see migraines  Would have to pay more - but interested in [facility]  Migraine-once per week for 24-48 hours  Can be one side or both Throbbing/light sens/ nausea and phenergan helps her not to vomit   Not getting enough sleep  A little worse during allergy season - perhaps?  Also has a new pet rabbit  Caffeine- drinks hot tea  Seldom coffee  Not a lot of exercise - work hours are too long   Taking some of her mother's xanax at night to sleep - can't shut her mind off   Is looking at changing jobs    Last tsh was high Lab Results  Component Value Date   TSH 5.94* 09/05/2014   Pt did not check her mychart message  Needs to inc thyroid dose Feels tired   Patient Active Problem List   Diagnosis Date Noted  . Kidney stones 04/22/2012  . UTI (lower urinary tract infection) 04/22/2012  . Right flank pain 04/22/2012  . Routine general medical examination at a health care facility 04/14/2012  . Fatigue 11/27/2011  . Obesity 11/27/2011  . Heavy menses 11/27/2011  . Sleep disorder 12/07/2010  . Migraine syndrome 12/07/2010  . Hyperlipidemia 12/28/2008  . ANEMIA, MILD 08/10/2008  . FATIGUE 12/15/2007  . Hypothyroidism 09/10/2007  . DEPRESSION 09/10/2007  . ASTHMA, CHILDHOOD 09/10/2007  . GERD 09/10/2007   Past Medical History  Diagnosis Date  . GERD (gastroesophageal reflux disease)   . Hypothyroidism   . Migraine, menstrual   . Anemia     mild  . Kidney stones    Past Surgical History  Procedure Laterality Date  . Cholecystectomy     History  Substance Use Topics  . Smoking  status: Former Smoker    Quit date: 07/29/1994  . Smokeless tobacco: Not on file  . Alcohol Use: Yes     Comment: Rarely   Family History  Problem Relation Age of Onset  . Diabetes Mother   . Anemia Mother     pernicioius   Allergies  Allergen Reactions  . Ciprofloxacin     REACTION: nausea and vomiting  . Erythromycin     REACTION: nausea and vomiting  . Morphine     REACTION: headache   Current Outpatient Prescriptions on File Prior to Visit  Medication Sig Dispense Refill  . promethazine (PHENERGAN) 25 MG tablet Take one tablet by mouth every 8 hours as needed for nausea (with headache) 15 tablet 3  . sertraline (ZOLOFT) 100 MG tablet TAKE 1 & 1/2 TABLETS BY MOUTH ONCE DAILY 135 tablet 0  . ketorolac (TORADOL) 10 MG tablet Take 1 tablet (10 mg total) by mouth every 6 (six) hours as needed (with food for headache). (Patient not taking: Reported on 11/07/2014) 30 tablet 1   No current facility-administered medications on file prior to visit.     Review of Systems Review of Systems  Constitutional: Negative for fever, appetite change,  and unexpected weight change.  Eyes: Negative for pain and visual disturbance.  Respiratory: Negative for cough  and shortness of breath.   Cardiovascular: Negative for cp or palpitations    Gastrointestinal: Negative for nausea, diarrhea and constipation.  Genitourinary: Negative for urgency and frequency.  Skin: Negative for pallor or rash   Neurological: Negative for weakness, light-headedness, numbness and  Pos for headaches.  Hematological: Negative for adenopathy. Does not bruise/bleed easily.  Psychiatric/Behavioral: Negative for dysphoric mood. The patient is not nervous/anxious.         Objective:   Physical Exam  Constitutional: She is oriented to person, place, and time. She appears well-developed and well-nourished. No distress.  HENT:  Head: Normocephalic and atraumatic.  Right Ear: External ear normal.  Left Ear: External  ear normal.  Nose: Nose normal.  Mouth/Throat: Oropharynx is clear and moist. No oropharyngeal exudate.  No sinus tenderness  Eyes: Conjunctivae and EOM are normal. Pupils are equal, round, and reactive to light. Right eye exhibits no discharge. Left eye exhibits no discharge. No scleral icterus.  No nystagmus  Neck: Normal range of motion and full passive range of motion without pain. Neck supple. No JVD present. Carotid bruit is not present. No tracheal deviation present. No thyromegaly present.  Cardiovascular: Normal rate, regular rhythm, normal heart sounds and intact distal pulses.  Exam reveals no gallop.   No murmur heard. Pulmonary/Chest: Effort normal and breath sounds normal. No respiratory distress. She has no wheezes. She has no rales.  Abdominal: Soft. Bowel sounds are normal. She exhibits no distension and no mass. There is no tenderness.  Musculoskeletal: She exhibits no edema or tenderness.  Lymphadenopathy:    She has no cervical adenopathy.  Neurological: She is alert and oriented to person, place, and time. She has normal strength and normal reflexes. She displays no atrophy and no tremor. No cranial nerve deficit or sensory deficit. She exhibits normal muscle tone. She displays a negative Romberg sign. Coordination and gait normal.  No focal cerebellar signs   Skin: Skin is warm and dry. No rash noted. No erythema. No pallor.  Psychiatric: She has a normal mood and affect. Her behavior is normal. Thought content normal.          Assessment & Plan:   Problem List Items Addressed This Visit      Cardiovascular and Mediastinum   Migraine syndrome - Primary    Worse lately Only tol topamax once daily  Also not sleeping  Much stress-looking for new job Try nortriptyline - (Discussed expectations of this medication including time to effectiveness and mechanism of action, also poss of side effects (early and late)- including mental fuzziness, weight or appetite change,  nausea and poss of worse dep or anxiety (even suicidal thoughts)  Pt voiced understanding and will stop med and update if this occurs   Hope will help with sleep and HA- can titrate up  Also ref to [employer] [facility] neuro         Relevant Medications   ibuprofen (ADVIL,MOTRIN) 200 MG tablet   nortriptyline (PAMELOR) capsule   Other Relevant Orders   Ambulatory referral to Neurology     Endocrine   Hypothyroidism    Pt did not check mychart for last results tsh high/ Free T4 slt low Inc levothyroxine to 150 mcg Lab 6 wk  States hair is falling out a bit       Relevant Medications   levothyroxine (SYNTHROID, LEVOTHROID) tablet

## 2014-11-07 NOTE — Progress Notes (Signed)
Pre visit review using our clinic review tool, if applicable. No additional management support is needed unless otherwise documented below in the visit note. 

## 2014-12-08 ENCOUNTER — Ambulatory Visit: Payer: Commercial Managed Care - PPO | Admitting: Neurology

## 2014-12-19 ENCOUNTER — Other Ambulatory Visit (INDEPENDENT_AMBULATORY_CARE_PROVIDER_SITE_OTHER): Payer: Commercial Managed Care - PPO

## 2014-12-19 DIAGNOSIS — E039 Hypothyroidism, unspecified: Secondary | ICD-10-CM

## 2014-12-19 LAB — TSH: TSH: 3.45 u[IU]/mL (ref 0.35–4.50)

## 2014-12-23 ENCOUNTER — Encounter: Payer: Self-pay | Admitting: Family Medicine

## 2015-01-05 ENCOUNTER — Encounter (HOSPITAL_BASED_OUTPATIENT_CLINIC_OR_DEPARTMENT_OTHER): Payer: Self-pay | Admitting: *Deleted

## 2015-01-10 ENCOUNTER — Encounter (HOSPITAL_BASED_OUTPATIENT_CLINIC_OR_DEPARTMENT_OTHER): Payer: Self-pay | Admitting: *Deleted

## 2015-01-10 NOTE — Progress Notes (Signed)
Pt instructed npo p mn 6/19. To [facility] 6/20 @ 0600.  hgb on arrival. Waiting on order entry from the office.  Pt aware if orders require changes in arrival time , she will be notified.  Pt agreeable.

## 2015-01-10 NOTE — H&P (Signed)
  44 year old G 1 P 1 with menorrhagia and fibroids.  She presents for LAVH.  Past Medical History  Diagnosis Date  . GERD (gastroesophageal reflux disease)   . Hypothyroidism   . Migraine, menstrual   . Anemia     mild  . Uterine fibroid   . History of kidney stones   . Family history of adverse reaction to anesthesia     mom had low O2 sats  . Arthritis     rt ankle  . Abnormal uterine bleeding (AUB)   . Constipation    Past Surgical History  Procedure Laterality Date  . Cholecystectomy     Family History  Problem Relation Age of Onset  . Diabetes Mother   . Anemia Mother     pernicioius  . Sleep apnea Mother   . Obesity Mother    History   Social History  . Marital Status: Single    Spouse Name: N/A  . Number of Children: 1  . Years of Education: N/A   Occupational History  . RN [employer]   Social History Main Topics  . Smoking status: Former Smoker    Quit date: 07/29/1994  . Smokeless tobacco: Not on file  . Alcohol Use: Yes     Comment: Rarely  . Drug Use: No  . Sexual Activity: Not on file   Other Topics Concern  . Not on file   Social History Narrative   Allergies: Ciprofloxacin; Erythromycin; and Morphine  Ht 5\' 8"  (1.727 m)  Wt 98.884 kg (218 lb)  BMI 33.15 kg/m2  LMP 12/28/2014 No results found for this or any previous visit (from the past 24 hour(s)). General alert and oriented Lung CTAB Car RRR Abdomen is soft and non tender  Pelvic 11 week size myomatous uterus  IMPRESSION: Menorrhagia Fibroids  PLAN: LAVH Possible TAH Risks reviewed Consent signed

## 2015-01-15 NOTE — Anesthesia Preprocedure Evaluation (Addendum)
Anesthesia Evaluation  Patient identified by MRN, date of birth, ID band Patient awake    Reviewed: Allergy & Precautions  History of Anesthesia Complications Negative for: history of anesthetic complications  Airway Mallampati: II  TM Distance: >3 FB Neck ROM: Full    Dental  (+) Teeth Intact, Dental Advisory Given   Pulmonary asthma , former smoker,    Pulmonary exam normal       Cardiovascular negative cardio ROS Normal cardiovascular exam    Neuro/Psych  Headaches, PSYCHIATRIC DISORDERS Depression    GI/Hepatic Neg liver ROS, GERD-  ,  Endo/Other  Hypothyroidism   Renal/GU negative Renal ROS     Musculoskeletal  (+) Arthritis -,   Abdominal   Peds  Hematology   Anesthesia Other Findings   Reproductive/Obstetrics                            Anesthesia Physical Anesthesia Plan  ASA: II  Anesthesia Plan: General   Post-op Pain Management:    Induction: Intravenous  Airway Management Planned: Oral ETT  Additional Equipment:   Intra-op Plan:   Post-operative Plan: Extubation in OR  Informed Consent: I have reviewed the patients History and Physical, chart, labs and discussed the procedure including the risks, benefits and alternatives for the proposed anesthesia with the patient or authorized representative who has indicated his/her understanding and acceptance.   Dental advisory given  Plan Discussed with: CRNA, Anesthesiologist and Surgeon  Anesthesia Plan Comments:        Anesthesia Quick Evaluation

## 2015-01-16 ENCOUNTER — Ambulatory Visit (HOSPITAL_BASED_OUTPATIENT_CLINIC_OR_DEPARTMENT_OTHER): Payer: Commercial Managed Care - PPO | Admitting: Anesthesiology

## 2015-01-16 ENCOUNTER — Encounter (HOSPITAL_COMMUNITY)
Admission: RE | Disposition: A | Discharge status: Home / Self Care | Payer: Self-pay | Source: Ambulatory Visit | Attending: Obstetrics and Gynecology

## 2015-01-16 ENCOUNTER — Encounter (HOSPITAL_BASED_OUTPATIENT_CLINIC_OR_DEPARTMENT_OTHER): Payer: Self-pay | Admitting: *Deleted

## 2015-01-16 ENCOUNTER — Observation Stay (HOSPITAL_BASED_OUTPATIENT_CLINIC_OR_DEPARTMENT_OTHER)
Admission: RE | Admit: 2015-01-16 | Discharge: 2015-01-17 | Disposition: A | Discharge status: Home / Self Care | Payer: Commercial Managed Care - PPO | Source: Ambulatory Visit | Attending: Obstetrics and Gynecology | Admitting: Obstetrics and Gynecology

## 2015-01-16 DIAGNOSIS — Z87442 Personal history of urinary calculi: Secondary | ICD-10-CM | POA: Diagnosis not present

## 2015-01-16 DIAGNOSIS — N920 Excessive and frequent menstruation with regular cycle: Principal | ICD-10-CM | POA: Insufficient documentation

## 2015-01-16 DIAGNOSIS — K219 Gastro-esophageal reflux disease without esophagitis: Secondary | ICD-10-CM | POA: Insufficient documentation

## 2015-01-16 DIAGNOSIS — E039 Hypothyroidism, unspecified: Secondary | ICD-10-CM | POA: Insufficient documentation

## 2015-01-16 DIAGNOSIS — M13871 Other specified arthritis, right ankle and foot: Secondary | ICD-10-CM | POA: Insufficient documentation

## 2015-01-16 DIAGNOSIS — N840 Polyp of corpus uteri: Secondary | ICD-10-CM | POA: Diagnosis not present

## 2015-01-16 DIAGNOSIS — D259 Leiomyoma of uterus, unspecified: Secondary | ICD-10-CM | POA: Insufficient documentation

## 2015-01-16 DIAGNOSIS — Z87891 Personal history of nicotine dependence: Secondary | ICD-10-CM | POA: Insufficient documentation

## 2015-01-16 DIAGNOSIS — Z90710 Acquired absence of both cervix and uterus: Secondary | ICD-10-CM | POA: Diagnosis present

## 2015-01-16 HISTORY — PX: LAPAROSCOPIC ASSISTED VAGINAL HYSTERECTOMY: SHX5398

## 2015-01-16 HISTORY — DX: Leiomyoma of uterus, unspecified: D25.9

## 2015-01-16 HISTORY — DX: Personal history of urinary calculi: Z87.442

## 2015-01-16 HISTORY — DX: Family history of other specified conditions: Z84.89

## 2015-01-16 HISTORY — DX: Abnormal uterine and vaginal bleeding, unspecified: N93.9

## 2015-01-16 HISTORY — DX: Constipation, unspecified: K59.00

## 2015-01-16 HISTORY — DX: Unspecified osteoarthritis, unspecified site: M19.90

## 2015-01-16 LAB — CBC
HCT: 35.9 % — ABNORMAL LOW (ref 36.0–46.0)
Hemoglobin: 11 g/dL — ABNORMAL LOW (ref 12.0–15.0)
MCH: 23.9 pg — ABNORMAL LOW (ref 26.0–34.0)
MCHC: 30.6 g/dL (ref 30.0–36.0)
MCV: 77.9 fL — ABNORMAL LOW (ref 78.0–100.0)
PLATELETS: 211 10*3/uL (ref 150–400)
RBC: 4.61 MIL/uL (ref 3.87–5.11)
RDW: 15.3 % (ref 11.5–15.5)
WBC: 6.6 10*3/uL (ref 4.0–10.5)

## 2015-01-16 LAB — ABO/RH: ABO/RH(D): AB POS

## 2015-01-16 LAB — TYPE AND SCREEN
ABO/RH(D): AB POS
Antibody Screen: NEGATIVE

## 2015-01-16 LAB — POCT PREGNANCY, URINE: Preg Test, Ur: NEGATIVE

## 2015-01-16 SURGERY — HYSTERECTOMY, VAGINAL, LAPAROSCOPY-ASSISTED
Anesthesia: General | Site: Uterus

## 2015-01-16 MED ORDER — SODIUM CHLORIDE 0.9 % IR SOLN
Status: DC | PRN
  Administered 2015-01-16: 500 mL

## 2015-01-16 MED ORDER — LACTATED RINGERS IV SOLN
INTRAVENOUS | Status: DC
Start: 2015-01-16 — End: 2015-01-16
  Filled 2015-01-16: qty 1000

## 2015-01-16 MED ORDER — ONDANSETRON HCL 4 MG/2ML IJ SOLN
4.0000 mg | Freq: Three times a day (TID) | INTRAMUSCULAR | Status: DC | PRN
  Administered 2015-01-16: 8 mg via INTRAVENOUS
  Filled 2015-01-16: qty 4

## 2015-01-16 MED ORDER — GLYCOPYRROLATE 0.2 MG/ML IJ SOLN
INTRAMUSCULAR | Status: DC | PRN
  Administered 2015-01-16: 0.4 mg via INTRAVENOUS

## 2015-01-16 MED ORDER — IBUPROFEN 200 MG PO TABS
600.0000 mg | ORAL_TABLET | Freq: Four times a day (QID) | ORAL | Status: DC | PRN
  Administered 2015-01-17: 600 mg via ORAL
  Filled 2015-01-16: qty 3

## 2015-01-16 MED ORDER — SUCCINYLCHOLINE CHLORIDE 20 MG/ML IJ SOLN
INTRAMUSCULAR | Status: DC | PRN
  Administered 2015-01-16: 100 mg via INTRAVENOUS

## 2015-01-16 MED ORDER — TOPIRAMATE 100 MG PO TABS
100.0000 mg | ORAL_TABLET | Freq: Two times a day (BID) | ORAL | Status: DC
  Filled 2015-01-16 (×4): qty 1

## 2015-01-16 MED ORDER — DEXAMETHASONE SODIUM PHOSPHATE 4 MG/ML IJ SOLN
INTRAMUSCULAR | Status: DC | PRN
  Administered 2015-01-16: 10 mg via INTRAVENOUS

## 2015-01-16 MED ORDER — HYDROMORPHONE HCL 1 MG/ML IJ SOLN
INTRAMUSCULAR | Status: AC
  Filled 2015-01-16: qty 1

## 2015-01-16 MED ORDER — PROPOFOL 10 MG/ML IV BOLUS
INTRAVENOUS | Status: DC | PRN
  Administered 2015-01-16: 180 mg via INTRAVENOUS

## 2015-01-16 MED ORDER — STERILE WATER FOR IRRIGATION IR SOLN
Status: DC | PRN
  Administered 2015-01-16: 500 mL

## 2015-01-16 MED ORDER — FENTANYL CITRATE (PF) 100 MCG/2ML IJ SOLN
INTRAMUSCULAR | Status: DC | PRN
  Administered 2015-01-16 (×3): 50 ug via INTRAVENOUS
  Administered 2015-01-16 (×2): 25 ug via INTRAVENOUS

## 2015-01-16 MED ORDER — SERTRALINE HCL 50 MG PO TABS
150.0000 mg | ORAL_TABLET | Freq: Every day | ORAL | Status: DC
  Filled 2015-01-16 (×2): qty 1

## 2015-01-16 MED ORDER — LACTATED RINGERS IV SOLN
INTRAVENOUS | Status: DC
  Administered 2015-01-16 – 2015-01-17 (×2): via INTRAVENOUS

## 2015-01-16 MED ORDER — HYDROMORPHONE HCL 1 MG/ML IJ SOLN
1.0000 mg | INTRAMUSCULAR | Status: DC | PRN
  Administered 2015-01-16 – 2015-01-17 (×3): 1 mg via INTRAVENOUS
  Filled 2015-01-16 (×3): qty 1

## 2015-01-16 MED ORDER — PROMETHAZINE HCL 25 MG PO TABS
25.0000 mg | ORAL_TABLET | Freq: Four times a day (QID) | ORAL | Status: DC | PRN
  Administered 2015-01-17: 25 mg via ORAL
  Filled 2015-01-16: qty 1

## 2015-01-16 MED ORDER — SCOPOLAMINE 1 MG/3DAYS TD PT72
MEDICATED_PATCH | TRANSDERMAL | Status: AC
  Filled 2015-01-16: qty 1

## 2015-01-16 MED ORDER — PROMETHAZINE HCL 25 MG/ML IJ SOLN
25.0000 mg | Freq: Four times a day (QID) | INTRAMUSCULAR | Status: DC | PRN
  Administered 2015-01-16 (×2): 25 mg via INTRAVENOUS
  Filled 2015-01-16 (×2): qty 1

## 2015-01-16 MED ORDER — LIDOCAINE HCL (CARDIAC) 20 MG/ML IV SOLN
INTRAVENOUS | Status: DC | PRN
  Administered 2015-01-16: 60 mg via INTRAVENOUS

## 2015-01-16 MED ORDER — ROCURONIUM BROMIDE 100 MG/10ML IV SOLN
INTRAVENOUS | Status: DC | PRN
  Administered 2015-01-16 (×3): 10 mg via INTRAVENOUS
  Administered 2015-01-16: 30 mg via INTRAVENOUS

## 2015-01-16 MED ORDER — CEFAZOLIN SODIUM-DEXTROSE 2-3 GM-% IV SOLR
INTRAVENOUS | Status: AC
  Filled 2015-01-16: qty 50

## 2015-01-16 MED ORDER — LACTATED RINGERS IV SOLN
INTRAVENOUS | Status: DC
  Administered 2015-01-16 (×4): via INTRAVENOUS
  Filled 2015-01-16: qty 1000

## 2015-01-16 MED ORDER — NORTRIPTYLINE HCL 10 MG PO CAPS
10.0000 mg | ORAL_CAPSULE | Freq: Every day | ORAL | Status: DC
  Filled 2015-01-16 (×2): qty 1

## 2015-01-16 MED ORDER — BUPIVACAINE HCL (PF) 0.25 % IJ SOLN
INTRAMUSCULAR | Status: DC | PRN
  Administered 2015-01-16: 6 mL

## 2015-01-16 MED ORDER — LEVOTHYROXINE SODIUM 150 MCG PO TABS
150.0000 ug | ORAL_TABLET | Freq: Every day | ORAL | Status: DC
  Filled 2015-01-16 (×3): qty 1

## 2015-01-16 MED ORDER — ONDANSETRON HCL 4 MG/2ML IJ SOLN
INTRAMUSCULAR | Status: DC | PRN
  Administered 2015-01-16: 4 mg via INTRAVENOUS

## 2015-01-16 MED ORDER — LACTATED RINGERS IR SOLN
Status: DC | PRN
  Administered 2015-01-16: 3000 mL

## 2015-01-16 MED ORDER — KETOROLAC TROMETHAMINE 30 MG/ML IJ SOLN
30.0000 mg | Freq: Once | INTRAMUSCULAR | Status: DC
Start: 2015-01-16 — End: 2015-01-16
  Filled 2015-01-16: qty 1

## 2015-01-16 MED ORDER — SCOPOLAMINE 1 MG/3DAYS TD PT72
1.0000 | MEDICATED_PATCH | TRANSDERMAL | Status: DC
  Administered 2015-01-16: 1.5 mg via TRANSDERMAL
  Filled 2015-01-16: qty 1

## 2015-01-16 MED ORDER — NEOSTIGMINE METHYLSULFATE 10 MG/10ML IV SOLN
INTRAVENOUS | Status: DC | PRN
  Administered 2015-01-16: 3 mg via INTRAVENOUS

## 2015-01-16 MED ORDER — MIDAZOLAM HCL 5 MG/5ML IJ SOLN
INTRAMUSCULAR | Status: DC | PRN
  Administered 2015-01-16: 2 mg via INTRAVENOUS

## 2015-01-16 MED ORDER — ACETAMINOPHEN 10 MG/ML IV SOLN
INTRAVENOUS | Status: DC | PRN
  Administered 2015-01-16: 1000 mg via INTRAVENOUS

## 2015-01-16 MED ORDER — OXYCODONE-ACETAMINOPHEN 5-325 MG PO TABS
1.0000 | ORAL_TABLET | ORAL | Status: DC | PRN
  Administered 2015-01-16 – 2015-01-17 (×3): 2 via ORAL
  Filled 2015-01-16 (×3): qty 2

## 2015-01-16 MED ORDER — KETOROLAC TROMETHAMINE 30 MG/ML IJ SOLN
INTRAMUSCULAR | Status: DC | PRN
  Administered 2015-01-16: 30 mg via INTRAVENOUS

## 2015-01-16 MED ORDER — TRAMADOL HCL 50 MG PO TABS: 50.0000 mg | ORAL_TABLET | Freq: Four times a day (QID) | ORAL | Status: DC | PRN

## 2015-01-16 MED ORDER — CEFAZOLIN SODIUM-DEXTROSE 2-3 GM-% IV SOLR
2.0000 g | INTRAVENOUS | Status: AC
  Administered 2015-01-16: 2 g via INTRAVENOUS
  Filled 2015-01-16: qty 50

## 2015-01-16 MED ORDER — PROMETHAZINE HCL 25 MG/ML IJ SOLN
6.2500 mg | INTRAMUSCULAR | Status: DC | PRN
  Filled 2015-01-16: qty 1

## 2015-01-16 MED ORDER — HYDROMORPHONE HCL 1 MG/ML IJ SOLN
0.2500 mg | INTRAMUSCULAR | Status: DC | PRN
  Administered 2015-01-16 (×2): 0.5 mg via INTRAVENOUS
  Filled 2015-01-16: qty 1

## 2015-01-16 MED ORDER — FENTANYL CITRATE (PF) 100 MCG/2ML IJ SOLN
INTRAMUSCULAR | Status: AC
  Filled 2015-01-16: qty 6

## 2015-01-16 MED ORDER — MENTHOL 3 MG MT LOZG
1.0000 | LOZENGE | OROMUCOSAL | Status: DC | PRN
Start: 2015-01-16 — End: 2015-01-17
  Filled 2015-01-16: qty 9

## 2015-01-16 MED ORDER — MIDAZOLAM HCL 2 MG/2ML IJ SOLN
INTRAMUSCULAR | Status: AC
  Filled 2015-01-16: qty 2

## 2015-01-16 SURGICAL SUPPLY — 75 items
APPLICATOR COTTON TIP 6IN STRL (MISCELLANEOUS) IMPLANT
BAG URINE DRAINAGE (UROLOGICAL SUPPLIES) ×3 IMPLANT
BANDAGE ADHESIVE 1X3 (GAUZE/BANDAGES/DRESSINGS) IMPLANT
BARRIER ADHS 3X4 INTERCEED (GAUZE/BANDAGES/DRESSINGS) IMPLANT
BLADE CLIPPER SURG (BLADE) IMPLANT
BLADE SURG 11 STRL SS (BLADE) ×3 IMPLANT
CANISTER SUCTION 2500CC (MISCELLANEOUS) ×6 IMPLANT
CATH FOLEY 2WAY SLVR  5CC 14FR (CATHETERS) ×2
CATH FOLEY 2WAY SLVR 5CC 14FR (CATHETERS) ×1 IMPLANT
CHLORAPREP W/TINT 26ML (MISCELLANEOUS) ×3 IMPLANT
CLOSURE WOUND 1/4X4 (GAUZE/BANDAGES/DRESSINGS)
COVER BACK TABLE 60X90IN (DRAPES) ×6 IMPLANT
DRAPE LG THREE QUARTER DISP (DRAPES) ×3 IMPLANT
DRAPE UNDERBUTTOCKS STRL (DRAPE) ×3 IMPLANT
ELECT BLADE 6.5 .24CM SHAFT (ELECTRODE) ×3 IMPLANT
ELECT REM PT RETURN 9FT ADLT (ELECTROSURGICAL) ×3
ELECTRODE REM PT RTRN 9FT ADLT (ELECTROSURGICAL) ×1 IMPLANT
FILTER SMOKE EVAC LAPAROSHD (FILTER) IMPLANT
GLOVE BIO SURGEON STRL SZ 6.5 (GLOVE) ×6 IMPLANT
GLOVE BIO SURGEON STRL SZ7 (GLOVE) ×6 IMPLANT
GLOVE BIO SURGEONS STRL SZ 6.5 (GLOVE) ×3
GOWN STRL REUS W/ TWL LRG LVL3 (GOWN DISPOSABLE) ×2 IMPLANT
GOWN STRL REUS W/ TWL XL LVL3 (GOWN DISPOSABLE) ×2 IMPLANT
GOWN STRL REUS W/TWL LRG LVL3 (GOWN DISPOSABLE) ×4
GOWN STRL REUS W/TWL XL LVL3 (GOWN DISPOSABLE) ×4
HEMOSTAT SURGICEL 2X14 (HEMOSTASIS) ×3 IMPLANT
HOLDER FOLEY CATH W/STRAP (MISCELLANEOUS) ×3 IMPLANT
LIQUID BAND (GAUZE/BANDAGES/DRESSINGS) ×3 IMPLANT
MANIFOLD NEPTUNE II (INSTRUMENTS) IMPLANT
NEEDLE HYPO 25X1 1.5 SAFETY (NEEDLE) ×3 IMPLANT
NEEDLE INSUFFLATION 14GA 120MM (NEEDLE) IMPLANT
NEEDLE INSUFFLATION 14GA 150MM (NEEDLE) IMPLANT
NEEDLE SPNL 22GX3.5 QUINCKE BK (NEEDLE) ×3 IMPLANT
NS IRRIG 500ML POUR BTL (IV SOLUTION) ×3 IMPLANT
PACK BASIN DAY SURGERY FS (CUSTOM PROCEDURE TRAY) ×3 IMPLANT
PAD OB MATERNITY 4.3X12.25 (PERSONAL CARE ITEMS) ×3 IMPLANT
PAD PREP 24X48 CUFFED NSTRL (MISCELLANEOUS) ×3 IMPLANT
PADDING ION DISPOSABLE (MISCELLANEOUS) ×3 IMPLANT
PENCIL BUTTON HOLSTER BLD 10FT (ELECTRODE) ×3 IMPLANT
POUCH SPECIMEN RETRIEVAL 10MM (ENDOMECHANICALS) IMPLANT
SCISSORS LAP 5X35 DISP (ENDOMECHANICALS) IMPLANT
SEALER TISSUE G2 CVD JAW 45CM (ENDOMECHANICALS) ×3 IMPLANT
SET IRRIG TUBING LAPAROSCOPIC (IRRIGATION / IRRIGATOR) ×3 IMPLANT
SHEET LAVH (DRAPES) ×3 IMPLANT
SOLUTION ANTI FOG 6CC (MISCELLANEOUS) ×3 IMPLANT
SOLUTION ELECTROLUBE (MISCELLANEOUS) IMPLANT
SPONGE GAUZE 4X4 12PLY STER LF (GAUZE/BANDAGES/DRESSINGS) ×3 IMPLANT
SPONGE LAP 4X18 X RAY DECT (DISPOSABLE) ×3 IMPLANT
STRIP CLOSURE SKIN 1/4X4 (GAUZE/BANDAGES/DRESSINGS) IMPLANT
SUT VIC AB 0 CT1 18XCR BRD 8 (SUTURE) ×2 IMPLANT
SUT VIC AB 0 CT1 36 (SUTURE) ×9 IMPLANT
SUT VIC AB 0 CT1 8-18 (SUTURE) ×4
SUT VIC AB 3-0 PS2 18 (SUTURE) ×2
SUT VIC AB 3-0 PS2 18XBRD (SUTURE) ×1 IMPLANT
SUT VIC AB 3-0 SH 27 (SUTURE)
SUT VIC AB 3-0 SH 27X BRD (SUTURE) IMPLANT
SUT VICRYL 0 TIES 12 18 (SUTURE) ×3 IMPLANT
SUT VICRYL 0 UR6 27IN ABS (SUTURE) ×3 IMPLANT
SYR 3ML 23GX1 SAFETY (SYRINGE) IMPLANT
SYR BULB IRRIGATION 50ML (SYRINGE) ×3 IMPLANT
SYR CONTROL 10ML LL (SYRINGE) ×3 IMPLANT
SYRINGE 10CC LL (SYRINGE) ×6 IMPLANT
TAPE PAPER 3X10 WHT MICROPORE (GAUZE/BANDAGES/DRESSINGS) ×3 IMPLANT
TOWEL NATURAL 6PK STERILE (DISPOSABLE) IMPLANT
TOWEL OR 17X24 6PK STRL BLUE (TOWEL DISPOSABLE) ×9 IMPLANT
TRAY DSU PREP LF (CUSTOM PROCEDURE TRAY) ×3 IMPLANT
TROCAR OPTI TIP 5M 100M (ENDOMECHANICALS) ×3 IMPLANT
TROCAR XCEL BLUNT TIP 100MML (ENDOMECHANICALS) IMPLANT
TROCAR XCEL DIL TIP R 11M (ENDOMECHANICALS) ×3 IMPLANT
TROCAR XCEL NON-BLD 11X100MML (ENDOMECHANICALS) IMPLANT
TUBE CONNECTING 12'X1/4 (SUCTIONS) ×2
TUBE CONNECTING 12X1/4 (SUCTIONS) ×4 IMPLANT
TUBING INSUFFLATION 10FT LAP (TUBING) ×3 IMPLANT
WATER STERILE IRR 500ML POUR (IV SOLUTION) ×3 IMPLANT
YANKAUER SUCT BULB TIP NO VENT (SUCTIONS) ×3 IMPLANT

## 2015-01-16 NOTE — Transfer of Care (Signed)
Immediate Anesthesia Transfer of Care Note  Patient: Miranda Gardner  Procedure(s) Performed: Procedure(s) (LRB): LAPAROSCOPIC ASSISTED VAGINAL HYSTERECTOMY (N/A)  Patient Location: PACU  Anesthesia Type: General  Level of Consciousness: awake, oriented, sedated and patient cooperative  Airway & Oxygen Therapy: Patient Spontanous Breathing and Patient connected to face mask oxygen  Post-op Assessment: Report given to PACU RN and Post -op Vital signs reviewed and stable  Post vital signs: Reviewed and stable  Complications: No apparent anesthesia complications

## 2015-01-16 NOTE — Anesthesia Postprocedure Evaluation (Signed)
Anesthesia Post Note  Patient: Miranda Gardner  Procedure(s) Performed: Procedure(s) (LRB): LAPAROSCOPIC ASSISTED VAGINAL HYSTERECTOMY (N/A)  Anesthesia type: general  Patient location: PACU  Post pain: Pain level controlled  Post assessment: Patient's Cardiovascular Status Stable  Last Vitals:  Filed Vitals:   01/16/15 1030  BP: 131/66  Pulse: 67  Temp:   Resp: 16    Post vital signs: Reviewed and stable  Level of consciousness: sedated  Complications: No apparent anesthesia complications

## 2015-01-16 NOTE — Anesthesia Procedure Notes (Signed)
Procedure Name: Intubation Date/Time: 01/16/2015 7:38 AM Performed by: Denna Haggard D Pre-anesthesia Checklist: Patient identified, Emergency Drugs available, Suction available and Patient being monitored Patient Re-evaluated:Patient Re-evaluated prior to inductionOxygen Delivery Method: Circle System Utilized Preoxygenation: Pre-oxygenation with 100% oxygen Intubation Type: IV induction Ventilation: Mask ventilation without difficulty Laryngoscope Size: Mac and 3 Grade View: Grade I Tube type: Oral Tube size: 7.0 mm Number of attempts: 1 Airway Equipment and Method: Stylet and Oral airway Placement Confirmation: ETT inserted through vocal cords under direct vision,  positive ETCO2 and breath sounds checked- equal and bilateral Secured at: 21 cm Tube secured with: Tape Dental Injury: Teeth and Oropharynx as per pre-operative assessment

## 2015-01-16 NOTE — Brief Op Note (Signed)
01/16/2015  9:34 AM  PATIENT:  Miranda Gardner  44 y.o. female  PRE-OPERATIVE DIAGNOSIS:  Fibroids, menorrhagia  POST-OPERATIVE DIAGNOSIS:  Fibroids, menorrhagia  PROCEDURE:  Procedure(s): LAPAROSCOPIC ASSISTED VAGINAL HYSTERECTOMY (N/A) Bilateral salpingectomy SURGEON:  Surgeon(s) and Role:    * Dian Queen, MD - Primary    * W Evette Cristal, MD - Assisting  PHYSICIAN ASSISTANT:   ASSISTANTS: none   ANESTHESIA:   general  EBL:  Total I/O In: 1000 [I.V.:1000] Out: 300 [Urine:200; Blood:100]  BLOOD ADMINISTERED:none  DRAINS: Urinary Catheter (Foley)   LOCAL MEDICATIONS USED:  LIDOCAINE   SPECIMEN:  Source of Specimen:  uterus, cervix and tubes  DISPOSITION OF SPECIMEN:  PATHOLOGY  COUNTS:  YES  TOURNIQUET:  * No tourniquets in log *  DICTATION: .Other Dictation: Dictation Number [id number]  PLAN OF CARE: Admit for overnight observation  PATIENT DISPOSITION:  PACU - hemodynamically stable.   Delay start of Pharmacological VTE agent (>24hrs) due to surgical blood loss or risk of bleeding: not applicable

## 2015-01-16 NOTE — Op Note (Signed)
Miranda Gardner, Miranda Gardner              ACCOUNT NO.:  [id number]  MEDICAL RECORD NO.:  [id number]  LOCATION:  [place]                         FACILITY:  [facility]  PHYSICIAN:  [place] Edmar Blankenburg, M.D.DATE OF BIRTH:  05-25-1971  DATE OF PROCEDURE:  01/16/2015 DATE OF DISCHARGE:                              OPERATIVE REPORT   PREOPERATIVE DIAGNOSES:  Menorrhagia and fibroids.  POSTOPERATIVE DIAGNOSES:  Menorrhagia and fibroids.  PROCEDURE:  Laparoscopically-assisted vaginal hysterectomy and bilateral salpingectomy.  SURGEON:  Jomo Forand L. Helane Rima, M.D.  ANESTHESIA:  General.  EBL:  100 mL.  COMPLICATIONS:  None.  DRAINS:  Foley.  PATHOLOGY:  Uterus, cervix, and fallopian tubes sent to Pathology.  DESCRIPTION OF PROCEDURE:  The patient was taken to the operating room. She was intubated.  Foley catheter was inserted and uterine manipulator was inserted.  Attention was turned to the abdomen where a small infraumbilical incision was made.  The Veress needle was inserted. Pneumoperitoneum was performed.  The 11-mm trocar was inserted.  The patient was gently placed in Trendelenburg position.  The 5-mm trocar was inserted under direct visualization.  Upon inspection of the pelvis, it revealed that she had normal ovaries, her uterus was enlarged and myomatous, no adhesions or endometriosis was noted.  We placed the EnSeal across the right fallopian tube, mesosalpinx burned that and carried that down to the round ligament.  After we performed that, we did that on the left side in excellent fashion and with excellent hemostasis.  Attention was turned to the abdomen where a weighted speculum was placed in the vagina, the cervix was deep in the vagina, we grasped the cervix with a tenaculum, made a circumferential incision around the cervix and then entered the posterior cul-de-sac using Mayo scissors and the anterior cul-de-sac using Metzenbaum scissors.  We then placed curved Heaney clamps  across each pedicle.  Each pedicle was clamped, cut, and suture ligated using 0-Vicryl suture.  We walked our way up the broad ligament.  Each pedicle was clamped, cut, and suture ligated using 0-Vicryl suture.  We then retroflexed the uterus.  Once we got to the fundus, removed the specimen.  After we clamped the remainder of the broad ligament, the specimen was identified as cervix, uterus, and fallopian tubes.  Each pedicle was clamped, cut, and suture ligated using 0-Vicryl suture.  We then placed figure-of-eights at the 3 and 9 o'clock positions, the vaginal cuff using 0-Vicryl suture, and we closed the posterior cuff in a running, locked fashion using 0-Vicryl suture and then we closed the anterior cuff using 0-Vicryl suture in running locked stitch.  Hemostasis was very good.  I went back up to the abdomen, replaced the pneumoperitoneum, irrigated the pelvis. Hemostasis was very good at all pedicles.  I then placed 1 piece of Surgicel across the vaginal cuff to minimize any adhesion formation later on.  The trocars were removed after pneumoperitoneum was released.  The skin was closed using 3-0 Vicryl interrupted.  All sponge, lap, and instrument counts were correct x2.  Urine was draining clear urine.  She was extubated and went to recovery room in stable condition.     Mackenzie Groom L. Helane Rima, M.D.  MLG/MEDQ  D:  01/16/2015  T:  01/16/2015  Job:  [id number]

## 2015-01-16 NOTE — Progress Notes (Signed)
H and P on the chart No significant changes Will proceed with LAVH and Bilateral salpingectomy Consent signed

## 2015-01-17 ENCOUNTER — Encounter (HOSPITAL_BASED_OUTPATIENT_CLINIC_OR_DEPARTMENT_OTHER): Payer: Self-pay | Admitting: Obstetrics and Gynecology

## 2015-01-17 DIAGNOSIS — N920 Excessive and frequent menstruation with regular cycle: Secondary | ICD-10-CM | POA: Diagnosis not present

## 2015-01-17 LAB — BASIC METABOLIC PANEL
Anion gap: 7 (ref 5–15)
BUN: 10 mg/dL (ref 6–20)
CALCIUM: 8.2 mg/dL — AB (ref 8.9–10.3)
CO2: 23 mmol/L (ref 22–32)
CREATININE: 0.66 mg/dL (ref 0.44–1.00)
Chloride: 110 mmol/L (ref 101–111)
GFR calc Af Amer: >60 mL/min (ref >60)
Glucose, Bld: 131 mg/dL — ABNORMAL HIGH (ref 65–99)
Potassium: 3.5 mmol/L (ref 3.5–5.1)
Sodium: 140 mmol/L (ref 135–145)

## 2015-01-17 LAB — CBC
HEMATOCRIT: 30.9 % — AB (ref 36.0–46.0)
Hemoglobin: 9.3 g/dL — ABNORMAL LOW (ref 12.0–15.0)
MCH: 23.7 pg — ABNORMAL LOW (ref 26.0–34.0)
MCHC: 30.1 g/dL (ref 30.0–36.0)
MCV: 78.8 fL (ref 78.0–100.0)
Platelets: 194 10*3/uL (ref 150–400)
RBC: 3.92 MIL/uL (ref 3.87–5.11)
RDW: 15.4 % (ref 11.5–15.5)
WBC: 10.1 10*3/uL (ref 4.0–10.5)

## 2015-01-17 MED ORDER — IBUPROFEN 600 MG PO TABS: 600.0000 mg | ORAL_TABLET | Freq: Four times a day (QID) | ORAL | Status: DC | PRN

## 2015-01-17 MED ORDER — HYDROMORPHONE HCL 4 MG PO TABS: 4.0000 mg | ORAL_TABLET | ORAL | Status: DC | PRN

## 2015-01-17 NOTE — Progress Notes (Signed)
S:  Patient is doing well. Complains of some left sided pain.  O:  BP 99/66 mmHg  Pulse 62  Temp(Src) 98 F (36.7 C) (Oral)  Resp 16  Ht 5\' 8"  (1.727 m)  Wt 100.472 kg (221 lb 8 oz)  BMI 33.69 kg/m2  SpO2 96%  LMP 12/28/2014 General alert and oriented Lung CTAB Car RRR Abdomen is soft and non tender Results for orders placed or performed during the hospital encounter of 01/16/15 (from the past 24 hour(s))  Basic metabolic panel     Status: Abnormal   Collection Time: 01/17/15  4:38 AM  Result Value Ref Range   Sodium 140 135 - 145 mmol/L   Potassium 3.5 3.5 - 5.1 mmol/L   Chloride 110 101 - 111 mmol/L   CO2 23 22 - 32 mmol/L   Glucose, Bld 131 (H) 65 - 99 mg/dL   BUN 10 6 - 20 mg/dL   Creatinine, Ser 0.66 0.44 - 1.00 mg/dL   Calcium 8.2 (L) 8.9 - 10.3 mg/dL   GFR calc non Af Amer >60 >60 mL/min   GFR calc Af Amer >60 >60 mL/min   Anion gap 7 5 - 15  CBC     Status: Abnormal   Collection Time: 01/17/15  4:38 AM  Result Value Ref Range   WBC 10.1 4.0 - 10.5 K/uL   RBC 3.92 3.87 - 5.11 MIL/uL   Hemoglobin 9.3 (L) 12.0 - 15.0 g/dL   HCT 30.9 (L) 36.0 - 46.0 %   MCV 78.8 78.0 - 100.0 fL   MCH 23.7 (L) 26.0 - 34.0 pg   MCHC 30.1 30.0 - 36.0 g/dL   RDW 15.4 11.5 - 15.5 %   Platelets 194 150 - 400 K/uL   IMPRESSION: POD #1 Doing well  PLAN: Remove Foley Transition to po meds Discharge home Follow up in 1 week

## 2015-01-17 NOTE — Discharge Summary (Signed)
  Admission Diagnosis: Menorrhagia Fibroids  Discharge Diagnosis: Same  Hospital Course: 44 year old G 1 P 1 with fibroids. Underwent LAVH and bilateral salpingectomy.  Did very well post op  By POD #1 she was ambulating, voiding and tolerating regular diet. Hemoglobin was 9.3 post op.  Discharged home with po Dilaudid and po Ibuprofen Follow up in 1 week

## 2015-01-17 NOTE — Progress Notes (Signed)
Patient is alert and oriented, vital signs are stable, incisions are within normal limits, patient tolerating regular diet fairly, oral pain meds adequate i pain control, patient to follow up with Helane Rima MD within 1 week, discharge instructions reviewed with patient and patient's mother Neta Mends RN 01-17-2015 11:15 AM

## 2015-02-07 ENCOUNTER — Other Ambulatory Visit: Payer: Self-pay | Admitting: Family Medicine

## 2015-02-07 NOTE — Telephone Encounter (Signed)
Please refill times one  

## 2015-02-07 NOTE — Telephone Encounter (Signed)
done

## 2015-02-07 NOTE — Telephone Encounter (Signed)
Electronic refill request, pt has CPE scheduled for 02/13/15, last refilled on 12/17/13 #15 with 3 additional refills, please advise

## 2015-02-13 ENCOUNTER — Encounter: Payer: Commercial Managed Care - PPO | Admitting: Family Medicine

## 2015-02-28 LAB — HM PAP SMEAR

## 2015-03-28 ENCOUNTER — Other Ambulatory Visit: Payer: Self-pay | Admitting: Family Medicine

## 2015-03-28 NOTE — Telephone Encounter (Signed)
Electronic refill request, pt has CPE scheduled on 04/25/15, please advise

## 2015-03-28 NOTE — Telephone Encounter (Signed)
Please refill all times 1

## 2015-03-29 NOTE — Telephone Encounter (Signed)
done

## 2015-04-19 ENCOUNTER — Other Ambulatory Visit: Payer: Self-pay

## 2015-04-19 MED ORDER — LEVOTHYROXINE SODIUM 150 MCG PO TABS: 150.0000 ug | ORAL_TABLET | Freq: Every day | ORAL | Status: DC

## 2015-04-19 NOTE — Telephone Encounter (Signed)
Pt request refill levothyroxine 150 mcg to [facility]. Refilled per protocol and pt notified refill done. Last seen 11/07/2014 and TSH done 11/2014.

## 2015-04-21 ENCOUNTER — Telehealth: Payer: Self-pay | Admitting: Family Medicine

## 2015-04-21 NOTE — Telephone Encounter (Signed)
That is fine -please find a 30 min slot that works [employer]

## 2015-04-21 NOTE — Telephone Encounter (Signed)
Patient had an appointment for her physical on 04/25/15.  Patient got a new job and her insurance won't start until October.  Patient is moving to [place] in November.  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  Can patient be seen sooner for a physical?

## 2015-04-25 ENCOUNTER — Encounter: Payer: Commercial Managed Care - PPO | Admitting: Family Medicine

## 2015-05-10 ENCOUNTER — Ambulatory Visit: Payer: Commercial Managed Care - PPO | Admitting: Family Medicine

## 2015-05-12 ENCOUNTER — Other Ambulatory Visit: Payer: Self-pay | Admitting: Family Medicine

## 2015-05-12 ENCOUNTER — Ambulatory Visit (INDEPENDENT_AMBULATORY_CARE_PROVIDER_SITE_OTHER): Payer: BC Managed Care – PPO | Admitting: Family Medicine

## 2015-05-12 ENCOUNTER — Encounter: Payer: Self-pay | Admitting: Family Medicine

## 2015-05-12 VITALS — BP 110/70 | HR 67 | Temp 97.8°F | Ht 68.0 in | Wt 225.5 lb

## 2015-05-12 DIAGNOSIS — G43109 Migraine with aura, not intractable, without status migrainosus: Secondary | ICD-10-CM | POA: Diagnosis not present

## 2015-05-12 DIAGNOSIS — G43909 Migraine, unspecified, not intractable, without status migrainosus: Secondary | ICD-10-CM

## 2015-05-12 NOTE — Progress Notes (Signed)
Subjective:    Patient ID: Miranda Gardner, female    DOB: [birth year], 44 y.o.   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    Takes phenergan Takes maxalt and ibuprofen   Had to cut the topamax 100 mg once daily   Gabapentin put her in a fog (more than usual)  Nortriptyline - she forgot to continue it -did not really help her sleep   Having a hard time concentrating  Even though less overwhelmed  Trying to study- has forgotten so much   Lab Results  Component Value Date   TSH 3.45 12/19/2014      Did refer to neurology  [facility] - no one took her insurance     Had a hysterectomy (partial)  Did well once recovered  Migraines went away for a while   Patient Active Problem List   Diagnosis Date Noted  . S/P laparoscopic assisted vaginal hysterectomy (LAVH) 01/16/2015  . Kidney stones 04/22/2012  . UTI (lower urinary tract infection) 04/22/2012  . Right flank pain 04/22/2012  . Routine general medical examination at a health care facility 04/14/2012  . Fatigue 11/27/2011  . Obesity 11/27/2011  . Heavy menses 11/27/2011  . Sleep disorder 12/07/2010  . Migraine syndrome 12/07/2010  . Hyperlipidemia 12/28/2008  . ANEMIA, MILD 08/10/2008  . FATIGUE 12/15/2007  . Hypothyroidism 09/10/2007  . DEPRESSION 09/10/2007  . ASTHMA, CHILDHOOD 09/10/2007  . GERD 09/10/2007   Past Medical History  Diagnosis Date    . GERD (gastroesophageal reflux disease)   . Hypothyroidism   . Migraine, menstrual   . Anemia     mild  . Uterine fibroid   . History of kidney stones   . Family history of adverse reaction to anesthesia     mom had low O2 sats  . Arthritis     rt ankle  . Abnormal uterine bleeding (AUB)   . Constipation    Past Surgical History  Procedure Laterality Date  . Cholecystectomy    . Laparoscopic assisted vaginal hysterectomy N/A 01/16/2015    Procedure: LAPAROSCOPIC ASSISTED VAGINAL HYSTERECTOMY;  Surgeon: Dian Queen, MD;  Location: [place];  Service: Gynecology;  Laterality: N/A;   Social History  Substance Use Topics  . Smoking status: Former Smoker    Quit date: 07/29/1994  . Smokeless tobacco: None  . Alcohol Use: 0.0 oz/week    0 Standard drinks or equivalent per week     Comment: Rarely   Family History  Problem Relation Age of Onset  . Diabetes Mother   . Anemia Mother     pernicioius  . Sleep apnea Mother   . Obesity Mother    Allergies  Allergen Reactions  . Ciprofloxacin     REACTION: nausea and vomiting  . Erythromycin     REACTION: nausea  and vomiting  . Morphine     REACTION: headache   Current Outpatient Prescriptions on File Prior to Visit  Medication Sig Dispense Refill  . ibuprofen (ADVIL,MOTRIN) 600 MG tablet Take 1 tablet (600 mg total) by mouth every 6 (six) hours as needed (mild pain). 60 tablet 0  . levothyroxine (SYNTHROID, LEVOTHROID) 150 MCG tablet Take 1 tablet (150 mcg total) by mouth daily. 90 tablet 1  . promethazine (PHENERGAN) 25 MG tablet TAKE 1 TABLET BY MOUTH EVERY 8 HOURS AS NEEDED FOR NAUSEA WITH HEADACHE 15 tablet 0  . rizatriptan (MAXALT) 10 MG tablet TAKE 1 TABLET BY MOUTH AS NEEDED AT ONSET OF MIGRAINE, MAY REPEAT 1 DOSE IN 2 HOURS IF NEEDED. (Patient taking differently: TAKE 10 MG BY MOUTH AS NEEDED AT ONSET OF MIGRAINE, MAY REPEAT 1 DOSE IN 2 HOURS IF NEEDED.) 27 tablet 11  . sertraline (ZOLOFT) 100 MG  tablet TAKE 1 & 1/2 TABLETS BY MOUTH ONCE DAILY 135 tablet 0  . topiramate (TOPAMAX) 100 MG tablet TAKE 1 TABLET BY MOUTH TWICE DAILY (Patient taking differently: TAKE 100 MG BY MOUTH ONCE DAILY) 180 tablet 2   No current facility-administered medications on file prior to visit.      Review of Systems Review of Systems  Constitutional: Negative for fever, appetite change, fatigue and unexpected weight change.  Eyes: Negative for pain and visual disturbance.  Respiratory: Negative for cough and shortness of breath.   Cardiovascular: Negative for cp or palpitations    Gastrointestinal: Negative for nausea, diarrhea and constipation.  Genitourinary: Negative for urgency and frequency.  Skin: Negative for pallor or rash   Neurological: Negative for weakness, light-headedness, numbness and pos for  headaches.  Hematological: Negative for adenopathy. Does not bruise/bleed easily.  Psychiatric/Behavioral: Negative for dysphoric mood. The patient is not nervous/anxious.         Objective:   Physical Exam  Constitutional: She is oriented to person, place, and time. She appears well-developed and well-nourished. No distress.  obese and well appearing   HENT:  Head: Normocephalic and atraumatic.  Right Ear: External ear normal.  Left Ear: External ear normal.  Nose: Nose normal.  Mouth/Throat: Oropharynx is clear and moist. No oropharyngeal exudate.  No sinus tenderness No temporal tenderness  No TMJ tenderness  Eyes: Conjunctivae and EOM are normal. Pupils are equal, round, and reactive to light. Right eye exhibits no discharge. Left eye exhibits no discharge. No scleral icterus.  No nystagmus  Neck: Normal range of motion and full passive range of motion without pain. Neck supple. No JVD present. Carotid bruit is not present. No tracheal deviation present. No thyromegaly present.  Cardiovascular: Normal rate, regular rhythm and normal heart sounds.   No murmur heard. Pulmonary/Chest:  Effort normal and breath sounds normal. No respiratory distress. She has no wheezes. She has no rales.  Abdominal: Soft. Bowel sounds are normal. She exhibits no distension and no mass. There is no tenderness.  Musculoskeletal: She exhibits no edema or tenderness.  Lymphadenopathy:    She has no cervical adenopathy.  Neurological: She is alert and oriented to person, place, and time. She has normal strength and normal reflexes. She displays no atrophy and no tremor. No cranial nerve deficit or sensory deficit. She exhibits normal muscle tone. She displays a negative Romberg sign. Coordination and gait normal.  No focal cerebellar signs   Skin: Skin is warm and dry. No rash noted. No pallor.  Psychiatric: She has a normal mood and affect. Her  behavior is normal. Thought content normal.          Assessment & Plan:   Problem List Items Addressed This Visit      Cardiovascular and Mediastinum   Migraine syndrome - Primary    Almost daily  Suspect multiple triggers Has failed topamax/gabapentin and tricyclic in the past  Has tryptan for rescue and nsaid (declines a muscle relaxer)  Will quit caffeine completely  Ref to neurology if ins covers   Of note -pt is planning move out of [place] country in the future      Relevant Orders   Ambulatory referral to Neurology

## 2015-05-12 NOTE — Patient Instructions (Signed)
Let's refer you to neurology again  Take care of yourself  Keep up water intake and exercise  Cut caffeine to none - unless you need some for a headache   Split your topamax and take 1/2 in am and 1/2 in the pm

## 2015-05-12 NOTE — Progress Notes (Signed)
Pre visit review using our clinic review tool, if applicable. No additional management support is needed unless otherwise documented below in the visit note. 

## 2015-05-14 NOTE — Assessment & Plan Note (Signed)
Almost daily  Suspect multiple triggers Has failed topamax/gabapentin and tricyclic in the past  Has tryptan for rescue and nsaid (declines a muscle relaxer)  Will quit caffeine completely  Ref to neurology if ins covers   Of note -pt is planning move out of the country in the future

## 2015-05-15 NOTE — Telephone Encounter (Signed)
Pt has OV on 05/12/15, last refilled on 03/29/15 #15 tabs with 0 refills, please advise

## 2015-05-15 NOTE — Telephone Encounter (Signed)
done

## 2015-05-15 NOTE — Telephone Encounter (Signed)
Please refill times 5 

## 2015-05-16 ENCOUNTER — Ambulatory Visit: Payer: BC Managed Care – PPO | Admitting: Neurology

## 2015-06-26 ENCOUNTER — Encounter: Payer: Self-pay | Admitting: Family Medicine

## 2015-06-26 ENCOUNTER — Ambulatory Visit (INDEPENDENT_AMBULATORY_CARE_PROVIDER_SITE_OTHER): Payer: BC Managed Care – PPO | Admitting: Family Medicine

## 2015-06-26 VITALS — BP 126/72 | HR 68 | Temp 98.1°F | Wt 219.2 lb

## 2015-06-26 DIAGNOSIS — J069 Acute upper respiratory infection, unspecified: Secondary | ICD-10-CM | POA: Diagnosis not present

## 2015-06-26 MED ORDER — HYDROCOD POLST-CPM POLST ER 10-8 MG/5ML PO SUER: 5.0000 mL | Freq: Two times a day (BID) | ORAL | Status: DC | PRN

## 2015-06-26 NOTE — Progress Notes (Signed)
Pre visit review using our clinic review tool, if applicable. No additional management support is needed unless otherwise documented below in the visit note. 

## 2015-06-26 NOTE — Progress Notes (Signed)
SUBJECTIVE:  Miranda Gardner is a 44 y.o. female who complains of coryza, congestion, sneezing, productive cough and fever for 7 days. She denies a history of anorexia and chest pain and has a history of asthma. Patient denies smoke cigarettes.   Current Outpatient Prescriptions on File Prior to Visit  Medication Sig Dispense Refill  . ibuprofen (ADVIL,MOTRIN) 600 MG tablet Take 1 tablet (600 mg total) by mouth every 6 (six) hours as needed (mild pain). 60 tablet 0  . levothyroxine (SYNTHROID, LEVOTHROID) 150 MCG tablet Take 1 tablet (150 mcg total) by mouth daily. 90 tablet 1  . promethazine (PHENERGAN) 25 MG tablet TAKE 1 TABLET BY MOUTH EVERY 8 HOURS AS NEEDED FOR NAUSEA WITH HEADACHE 15 tablet 4  . rizatriptan (MAXALT) 10 MG tablet TAKE 1 TABLET BY MOUTH AS NEEDED AT ONSET OF MIGRAINE, MAY REPEAT 1 DOSE IN 2 HOURS IF NEEDED. (Patient taking differently: TAKE 10 MG BY MOUTH AS NEEDED AT ONSET OF MIGRAINE, MAY REPEAT 1 DOSE IN 2 HOURS IF NEEDED.) 27 tablet 11  . sertraline (ZOLOFT) 100 MG tablet TAKE 1 & 1/2 TABLETS BY MOUTH ONCE DAILY 135 tablet 0  . topiramate (TOPAMAX) 100 MG tablet TAKE 1 TABLET BY MOUTH TWICE DAILY (Patient taking differently: TAKE 100 MG BY MOUTH ONCE DAILY) 180 tablet 2   No current facility-administered medications on file prior to visit.    Allergies  Allergen Reactions  . Ciprofloxacin     REACTION: nausea and vomiting  . Erythromycin     REACTION: nausea and vomiting  . Morphine     REACTION: headache    Past Medical History  Diagnosis Date  . GERD (gastroesophageal reflux disease)   . Hypothyroidism   . Migraine, menstrual   . Anemia     mild  . Uterine fibroid   . History of kidney stones   . Family history of adverse reaction to anesthesia     mom had low O2 sats  . Arthritis     rt ankle  . Abnormal uterine bleeding (AUB)   . Constipation     Past Surgical History  Procedure Laterality Date  . Cholecystectomy    . Laparoscopic assisted  vaginal hysterectomy N/A 01/16/2015    Procedure: LAPAROSCOPIC ASSISTED VAGINAL HYSTERECTOMY;  Surgeon: Dian Queen, MD;  Location: [facility];  Service: Gynecology;  Laterality: N/A;    Family History  Problem Relation Age of Onset  . Diabetes Mother   . Anemia Mother     pernicioius  . Sleep apnea Mother   . Obesity Mother     Social History   Social History  . Marital Status: Single    Spouse Name: N/A  . Number of Children: 1  . Years of Education: N/A   Occupational History  . RN [employer]   Social History Main Topics  . Smoking status: Former Smoker    Quit date: 07/29/1994  . Smokeless tobacco: Not on file  . Alcohol Use: 0.0 oz/week    0 Standard drinks or equivalent per week     Comment: Rarely  . Drug Use: No  . Sexual Activity: Not on file   Other Topics Concern  . Not on file   Social History Narrative   The PMH, PSH, Social History, Family History, Medications, and allergies have been reviewed in [facility], and have been updated if relevant.  OBJECTIVE: BP 126/72 mmHg  Pulse 68  Temp(Src) 98.1 F (36.7 C) (Oral)  Wt 219  lb 4 oz (99.451 kg)  SpO2 98%  LMP 12/28/2014  She appears well, vital signs are as noted. Ears normal.  Throat and pharynx normal.  Neck supple. No adenopathy in the neck. Nose is congested. Sinuses non tender. The chest is clear, without wheezes or rales.  ASSESSMENT:  viral upper respiratory illness  PLAN: Symptomatic therapy suggested: push fluids, rest and return office visit prn if symptoms persist or worsen. Lack of antibiotic effectiveness discussed with her. Call or return to clinic prn if these symptoms worsen or fail to improve as anticipated.

## 2015-08-07 ENCOUNTER — Other Ambulatory Visit: Payer: Self-pay | Admitting: Family Medicine

## 2015-08-31 ENCOUNTER — Ambulatory Visit: Payer: BC Managed Care – PPO | Admitting: Podiatry

## 2015-11-07 ENCOUNTER — Other Ambulatory Visit: Payer: Self-pay | Admitting: Family Medicine

## 2015-11-07 NOTE — Telephone Encounter (Signed)
Last refilled on 11/07/14 #27 tabs with 11 add refills, last OV for HA/migraines was 05/12/15, please advise

## 2015-11-07 NOTE — Telephone Encounter (Signed)
Please refill times 5 Make her a f/u for 10/17  Thanks

## 2015-11-08 NOTE — Telephone Encounter (Signed)
Rx sent and pt notified she needs f/u, when I called she was driving so she couldn't schedule appt but she will call back once she is home to get appt scheduled

## 2015-11-24 ENCOUNTER — Ambulatory Visit (INDEPENDENT_AMBULATORY_CARE_PROVIDER_SITE_OTHER): Payer: BC Managed Care – PPO | Admitting: Sports Medicine

## 2015-11-24 ENCOUNTER — Encounter: Payer: Self-pay | Admitting: Sports Medicine

## 2015-11-24 DIAGNOSIS — M79674 Pain in right toe(s): Secondary | ICD-10-CM

## 2015-11-24 DIAGNOSIS — M79675 Pain in left toe(s): Secondary | ICD-10-CM

## 2015-11-24 DIAGNOSIS — M2040 Other hammer toe(s) (acquired), unspecified foot: Secondary | ICD-10-CM | POA: Diagnosis not present

## 2015-11-24 DIAGNOSIS — L84 Corns and callosities: Secondary | ICD-10-CM

## 2015-11-24 NOTE — Patient Instructions (Signed)
Hammer Toes Hammer toes is a condition in which one or more of your toes is permanently flexed. CAUSES  This happens when a muscle imbalance or abnormal bone length makes your small toes buckle. This causes the toe joint to contract and the strong cord-like bands that attach muscles to the bones (tendons) in your toes to shorten.  SIGNS AND SYMPTOMS  Common symptoms of flexible hammer toes include:   A buildup of skin cells (corns). Corns occur where boney bumps come in frequent contact with hard surfaces. For example, where your shoes press and rub.  Irritation.  Inflammation.  Pain.  Limited motion in your toes. DIAGNOSIS  Hammer toes are diagnosed through a physical exam of your toes. During the exam, your health care provider may try to reproduce your symptoms by manipulating your foot. Often, X-ray exams are done to determine the degree of deformity and to make sure that the cause is not a fracture.  TREATMENT  Hammer toes can be treated with corrective surgery. There are several types of surgical procedures that can treat hammer toes. The most common procedures include:  Arthroplasty--A portion of the joint is surgically removed and your toe is straightened. The gap fills in with fibrous tissue. This procedure helps treat pain and deformity and helps restore function.  Fusion--Cartilage between the two bones of the affected joint is taken out and the bones fuse together into one longer bone. This helps keep your toe stable and reduces pain but leaves your toe stiff, yet straight.  Implantation--A portion of your bone is removed and replaced with an implant to restore motion.  Flexor tendon transfers--This procedure repositions the tendons that curl the toes down (flexor tendons). This may be done to release the deforming force that causes your toe to buckle. Several of these procedures require fixing your toe with a pin that is visible at the tip of your toe. The pin keeps the toe  straight during healing. Your health care provider will remove the pin usually within 4-8 weeks after the procedure.    This information is not intended to replace advice given to you by your health care provider. Make sure you discuss any questions you have with your health care provider.   Document Released: 07/12/2000 Document Revised: 07/20/2013 Document Reviewed: 03/22/2013 Elsevier Interactive Patient Education 2016 [employer]  

## 2015-11-24 NOTE — Progress Notes (Signed)
Patient ID: Miranda Gardner, female   DOB: 08-Apr-1971, 45 y.o.   MRN: [id number] Subjective: Miranda Gardner is a 45 y.o. female patient who presents to office for evaluation of Right> Left foot pain. Patient complains of progressive pain especially over the last 6 months in the Right>Left foot at the 5th toes at the hard corn sites. Patient has tried pedicures, corn pads, toe cushions with no relief in symptoms. Patient denies any other pedal complaints.   Patient Active Problem List   Diagnosis Date Noted  . S/P laparoscopic assisted vaginal hysterectomy (LAVH) 01/16/2015  . Kidney stones 04/22/2012  . UTI (lower urinary tract infection) 04/22/2012  . Right flank pain 04/22/2012  . Routine general medical examination at a health care facility 04/14/2012  . Fatigue 11/27/2011  . Obesity 11/27/2011  . Heavy menses 11/27/2011  . Sleep disorder 12/07/2010  . Migraine syndrome 12/07/2010  . Hyperlipidemia 12/28/2008  . ANEMIA, MILD 08/10/2008  . FATIGUE 12/15/2007  . Hypothyroidism 09/10/2007  . DEPRESSION 09/10/2007  . ASTHMA, CHILDHOOD 09/10/2007  . GERD 09/10/2007    Current Outpatient Prescriptions on File Prior to Visit  Medication Sig Dispense Refill  . chlorpheniramine-HYDROcodone (TUSSIONEX PENNKINETIC ER) 10-8 MG/5ML SUER Take 5 mLs by mouth every 12 (twelve) hours as needed for cough. 140 mL 0  . ibuprofen (ADVIL,MOTRIN) 600 MG tablet Take 1 tablet (600 mg total) by mouth every 6 (six) hours as needed (mild pain). 60 tablet 0  . levothyroxine (SYNTHROID, LEVOTHROID) 150 MCG tablet Take 1 tablet (150 mcg total) by mouth daily. 90 tablet 1  . promethazine (PHENERGAN) 25 MG tablet TAKE 1 TABLET BY MOUTH EVERY 8 HOURS AS NEEDED FOR NAUSEA WITH HEADACHE 15 tablet 4  . rizatriptan (MAXALT) 10 MG tablet TAKE 1 TABLET BY MOUTH AS NEEDED AT ONSET OF MIGRAINE, MAY REPEAT 1 DOSE IN 2 HOURS IF NEEDED 27 tablet 5  . sertraline (ZOLOFT) 100 MG tablet TAKE 1 & 1/2 TABLET BY MOUTH ONCE A DAY  135 tablet 0  . topiramate (TOPAMAX) 100 MG tablet TAKE 1 TABLET BY MOUTH TWICE DAILY (Patient taking differently: TAKE 100 MG BY MOUTH ONCE DAILY) 180 tablet 2   No current facility-administered medications on file prior to visit.    Allergies  Allergen Reactions  . Ciprofloxacin     REACTION: nausea and vomiting  . Erythromycin     REACTION: nausea and vomiting  . Morphine     REACTION: headache    Objective:  General: Alert and oriented x3 in no acute distress  Dermatology: Small hyperkeratotic lesion overlying 5 PIPJ dorsally and at distal tuft bilateral. No open lesions bilateral lower extremities, no webspace macerations, no ecchymosis bilateral, all nails x 10 are well manicured.  Vascular: Dorsalis Pedis and Posterior Tibial pedal pulses 2/4, Capillary Fill Time 3 seconds,(+) pedal hair growth bilateral, no edema bilateral lower extremities, Temperature gradient within normal limits.  Neurology: Gross sensation intact via light touch bilateral, Protective sensation intact  with Semmes Weinstein Monofilament to all pedal sites, Position sense intact, vibratory intact bilateral, Deep tendon reflexes within normal limits bilateral, No babinski sign present bilateral. (- )Tinels sign bilateral.   Musculoskeletal: Semi-flexible hammertoes #5  Bilateral with Mild tenderness with palpation at PIPJ Right>Left. Ankle, Subtalar, Midtarsal, and MTPJ joint range of motion is within normal limits, there is no 1st ray hypermobility noted bilateral, No bunion deformity noted bilateral. No pain with calf compression bilateral.  Strength within normal limits in all groups bilateral.  Assessment and Plan: Problem List Items Addressed This Visit    None    Visit Diagnoses    Corns and callosities    -  Primary    R>L 5th toes    Hammer toe, unspecified laterality        Toe pain, bilateral            -Complete examination performed -Discussed treatement options for corn related to  hammertoe -Mechanically debrided callus at 5th toe bilateral without incident -Recommended good supportive shoes and inserts -Gave silicone separator and instructed on use -Encouraged patient to consider surgical management; Patient wants to think about it; Advised patient she will need xrays when she is ready to have surgery. -Patient to return to office as needed or sooner if condition worsens.  Landis Martins, DPM

## 2015-12-13 ENCOUNTER — Other Ambulatory Visit: Payer: Self-pay | Admitting: Family Medicine

## 2015-12-19 ENCOUNTER — Ambulatory Visit (INDEPENDENT_AMBULATORY_CARE_PROVIDER_SITE_OTHER): Payer: BC Managed Care – PPO | Admitting: Family Medicine

## 2015-12-19 ENCOUNTER — Encounter: Payer: Self-pay | Admitting: Family Medicine

## 2015-12-19 VITALS — BP 126/82 | HR 71 | Temp 98.4°F | Ht 68.0 in | Wt 220.5 lb

## 2015-12-19 DIAGNOSIS — J069 Acute upper respiratory infection, unspecified: Secondary | ICD-10-CM | POA: Insufficient documentation

## 2015-12-19 DIAGNOSIS — B9789 Other viral agents as the cause of diseases classified elsewhere: Principal | ICD-10-CM

## 2015-12-19 MED ORDER — GUAIFENESIN-CODEINE 100-10 MG/5ML PO SYRP: 5.0000 mL | ORAL_SOLUTION | Freq: Every evening | ORAL | Status: DC | PRN

## 2015-12-19 MED ORDER — MAGIC MOUTHWASH W/LIDOCAINE: 5.0000 mL | Freq: Three times a day (TID) | ORAL | Status: DC

## 2015-12-19 MED ORDER — BENZONATATE 200 MG PO CAPS: 200.0000 mg | ORAL_CAPSULE | Freq: Three times a day (TID) | ORAL | Status: DC | PRN

## 2015-12-19 NOTE — Assessment & Plan Note (Signed)
With post viral cough syndrome  Reassuring exam  Mouth ulcers-will try magic mouthwash with lidocaine Tessalon tid guif-codiene with caution at bedtime  Fluids/rest  Disc symptomatic care - see instructions on AVS  Update if not starting to improve in a week or if worsening

## 2015-12-19 NOTE — Progress Notes (Signed)
Subjective:    Patient ID: Miranda Gardner, female    DOB: 1971/05/06, 45 y.o.   MRN: [id number]  HPI Here with uri symptoms   Started last week Tuesday  Worse after working outdoors  Dry throat causing cough - used cough drops and they caused mouth ulcers   Throat is getting better  Ulcers are still there  Sinuses are starting to hurt-both sides   Nasal congestion - dark yellow mucous  Cough is bronchial/harsh sounding - not junky or productive  No wheezing   Ears are fine  No fever   otc- mucinex DM and multi symptom cold reliever  Patient Active Problem List   Diagnosis Date Noted  . S/P laparoscopic assisted vaginal hysterectomy (LAVH) 01/16/2015  . Kidney stones 04/22/2012  . UTI (lower urinary tract infection) 04/22/2012  . Right flank pain 04/22/2012  . Routine general medical examination at a health care facility 04/14/2012  . Fatigue 11/27/2011  . Obesity 11/27/2011  . Heavy menses 11/27/2011  . Sleep disorder 12/07/2010  . Migraine syndrome 12/07/2010  . Hyperlipidemia 12/28/2008  . ANEMIA, MILD 08/10/2008  . FATIGUE 12/15/2007  . Hypothyroidism 09/10/2007  . DEPRESSION 09/10/2007  . ASTHMA, CHILDHOOD 09/10/2007  . GERD 09/10/2007   Past Medical History  Diagnosis Date  . GERD (gastroesophageal reflux disease)   . Hypothyroidism   . Migraine, menstrual   . Anemia     mild  . Uterine fibroid   . History of kidney stones   . Family history of adverse reaction to anesthesia     mom had low O2 sats  . Arthritis     rt ankle  . Abnormal uterine bleeding (AUB)   . Constipation    Past Surgical History  Procedure Laterality Date  . Cholecystectomy    . Laparoscopic assisted vaginal hysterectomy N/A 01/16/2015    Procedure: LAPAROSCOPIC ASSISTED VAGINAL HYSTERECTOMY;  Surgeon: Dian Queen, MD;  Location: [place];  Service: Gynecology;  Laterality: N/A;   Social History  Substance Use Topics  . Smoking status: Former Smoker     Quit date: 07/29/1994  . Smokeless tobacco: None  . Alcohol Use: 0.0 oz/week    0 Standard drinks or equivalent per week     Comment: Rarely   Family History  Problem Relation Age of Onset  . Diabetes Mother   . Anemia Mother     pernicioius  . Sleep apnea Mother   . Obesity Mother    Allergies  Allergen Reactions  . Ciprofloxacin     REACTION: nausea and vomiting  . Erythromycin     REACTION: nausea and vomiting  . Morphine     REACTION: headache   Current Outpatient Prescriptions on File Prior to Visit  Medication Sig Dispense Refill  . ibuprofen (ADVIL,MOTRIN) 600 MG tablet Take 1 tablet (600 mg total) by mouth every 6 (six) hours as needed (mild pain). 60 tablet 0  . levothyroxine (SYNTHROID, LEVOTHROID) 150 MCG tablet Take 1 tablet (150 mcg total) by mouth daily. *needs to schedule physical appt. before future refills are given* 90 tablet 0  . promethazine (PHENERGAN) 25 MG tablet TAKE 1 TABLET BY MOUTH EVERY 8 HOURS AS NEEDED FOR NAUSEA WITH HEADACHE 15 tablet 4  . rizatriptan (MAXALT) 10 MG tablet TAKE 1 TABLET BY MOUTH AS NEEDED AT ONSET OF MIGRAINE, MAY REPEAT 1 DOSE IN 2 HOURS IF NEEDED 27 tablet 5  . sertraline (ZOLOFT) 100 MG tablet TAKE 1 & 1/2 TABLET BY  MOUTH ONCE A DAY 135 tablet 0  . topiramate (TOPAMAX) 100 MG tablet TAKE 1 TABLET BY MOUTH TWICE DAILY (Patient taking differently: TAKE 100 MG BY MOUTH ONCE DAILY) 180 tablet 2   No current facility-administered medications on file prior to visit.    Review of Systems  Constitutional: Positive for appetite change and fatigue. Negative for fever.  HENT: Positive for congestion, mouth sores, postnasal drip, rhinorrhea, sinus pressure, sneezing and sore throat. Negative for ear pain.   Eyes: Negative for pain and discharge.  Respiratory: Positive for cough. Negative for shortness of breath, wheezing and stridor.   Cardiovascular: Negative for chest pain.  Gastrointestinal: Negative for nausea, vomiting and  diarrhea.  Genitourinary: Negative for urgency, frequency and hematuria.  Musculoskeletal: Negative for myalgias and arthralgias.  Skin: Negative for rash.  Neurological: Positive for headaches. Negative for dizziness, weakness and light-headedness.  Psychiatric/Behavioral: Negative for confusion and dysphoric mood.       Objective:   Physical Exam  Constitutional: She appears well-developed and well-nourished. No distress.  obese and well appearing   HENT:  Head: Normocephalic and atraumatic.  Right Ear: External ear normal.  Left Ear: External ear normal.  Mouth/Throat: Oropharynx is clear and moist.  Nares are injected and congested  No sinus tenderness Clear rhinorrhea and post nasal drip   Scattered mouth ulcers noted  Eyes: Conjunctivae and EOM are normal. Pupils are equal, round, and reactive to light. Right eye exhibits no discharge. Left eye exhibits no discharge.  Neck: Normal range of motion. Neck supple.  Cardiovascular: Normal rate and normal heart sounds.   Pulmonary/Chest: Effort normal and breath sounds normal. No respiratory distress. She has no wheezes. She has no rales. She exhibits no tenderness.  Harsh cough  Lymphadenopathy:    She has no cervical adenopathy.  Neurological: She is alert.  Skin: Skin is warm and dry. No rash noted.  No sores on palms or soles  Psychiatric: She has a normal mood and affect.          Assessment & Plan:   Problem List Items Addressed This Visit      Respiratory   Viral URI with cough - Primary    With post viral cough syndrome  Reassuring exam  Mouth ulcers-will try magic mouthwash with lidocaine Tessalon tid guif-codiene with caution at bedtime  Fluids/rest  Disc symptomatic care - see instructions on AVS  Update if not starting to improve in a week or if worsening         Relevant Medications   magic mouthwash w/lidocaine SOLN

## 2015-12-19 NOTE — Patient Instructions (Signed)
Try to drink fluids and get rest Magic mouthwash may help heal mouth  Tessalon for cough three times daily  guifen-codiene at bedtime Update if not starting to improve in a week or if worsening  - especially if sinus pain or fever

## 2015-12-19 NOTE — Progress Notes (Signed)
Pre visit review using our clinic review tool, if applicable. No additional management support is needed unless otherwise documented below in the visit note. 

## 2015-12-20 ENCOUNTER — Telehealth: Payer: Self-pay | Admitting: *Deleted

## 2015-12-20 MED ORDER — AMOXICILLIN-POT CLAVULANATE 875-125 MG PO TABS: 1.0000 | ORAL_TABLET | Freq: Two times a day (BID) | ORAL | Status: DC

## 2015-12-20 NOTE — Telephone Encounter (Signed)
Left voicemail letting pt know Rx sent to pharmacy  

## 2015-12-20 NOTE — Telephone Encounter (Signed)
Patient left a voicemail stating that she was seen yesterday and was told to call back today if she felt that she needed an antibiotic. Patient stated that she is feeling worse today with a lot of sinus pressure and sinus drainage down the back of her throat. Patient stated that the cough medication has not helped at all. Pharmacy: [facility]

## 2015-12-20 NOTE — Telephone Encounter (Signed)
I sent augmentin in to [facility]

## 2016-01-12 ENCOUNTER — Other Ambulatory Visit: Payer: Self-pay | Admitting: Family Medicine

## 2016-03-25 ENCOUNTER — Other Ambulatory Visit (INDEPENDENT_AMBULATORY_CARE_PROVIDER_SITE_OTHER): Payer: BC Managed Care – PPO

## 2016-03-25 ENCOUNTER — Telehealth (INDEPENDENT_AMBULATORY_CARE_PROVIDER_SITE_OTHER): Payer: BC Managed Care – PPO | Admitting: Family Medicine

## 2016-03-25 ENCOUNTER — Ambulatory Visit: Payer: BC Managed Care – PPO | Admitting: Family Medicine

## 2016-03-25 DIAGNOSIS — E785 Hyperlipidemia, unspecified: Secondary | ICD-10-CM

## 2016-03-25 DIAGNOSIS — D649 Anemia, unspecified: Secondary | ICD-10-CM | POA: Diagnosis not present

## 2016-03-25 DIAGNOSIS — E039 Hypothyroidism, unspecified: Secondary | ICD-10-CM

## 2016-03-25 LAB — COMPREHENSIVE METABOLIC PANEL
ALK PHOS: 62 U/L (ref 39–117)
ALT: 12 U/L (ref 0–35)
AST: 12 U/L (ref 0–37)
Albumin: 3.9 g/dL (ref 3.5–5.2)
BILIRUBIN TOTAL: 0.4 mg/dL (ref 0.2–1.2)
BUN: 13 mg/dL (ref 6–23)
CALCIUM: 8.6 mg/dL (ref 8.4–10.5)
CO2: 24 meq/L (ref 19–32)
Chloride: 108 mEq/L (ref 96–112)
Creatinine, Ser: 0.74 mg/dL (ref 0.40–1.20)
GFR: 90.19 mL/min (ref >60.00)
Glucose, Bld: 103 mg/dL — ABNORMAL HIGH (ref 70–99)
Potassium: 3.2 mEq/L — ABNORMAL LOW (ref 3.5–5.1)
Sodium: 139 mEq/L (ref 135–145)
Total Protein: 7.4 g/dL (ref 6.0–8.3)

## 2016-03-25 LAB — CBC WITH DIFFERENTIAL/PLATELET
BASOS ABS: 0 10*3/uL (ref 0.0–0.1)
Basophils Relative: 0.6 % (ref 0.0–3.0)
Eosinophils Absolute: 0.1 10*3/uL (ref 0.0–0.7)
Eosinophils Relative: 2.4 % (ref 0.0–5.0)
HEMATOCRIT: 39.1 % (ref 36.0–46.0)
HEMOGLOBIN: 12.8 g/dL (ref 12.0–15.0)
LYMPHS PCT: 30.1 % (ref 12.0–46.0)
Lymphs Abs: 1.8 10*3/uL (ref 0.7–4.0)
MCHC: 32.8 g/dL (ref 30.0–36.0)
MCV: 79.3 fl (ref 78.0–100.0)
MONOS PCT: 5.9 % (ref 3.0–12.0)
Monocytes Absolute: 0.4 10*3/uL (ref 0.1–1.0)
NEUTROS ABS: 3.7 10*3/uL (ref 1.4–7.7)
Neutrophils Relative %: 61 % (ref 43.0–77.0)
Platelets: 228 10*3/uL (ref 150.0–400.0)
RBC: 4.93 Mil/uL (ref 3.87–5.11)
RDW: 15 % (ref 11.5–15.5)
WBC: 6.1 10*3/uL (ref 4.0–10.5)

## 2016-03-25 LAB — LIPID PANEL
CHOL/HDL RATIO: 4
Cholesterol: 208 mg/dL — ABNORMAL HIGH (ref 0–200)
HDL: 49.5 mg/dL (ref >39.00)
LDL CALC: 131 mg/dL — AB (ref 0–99)
NONHDL: 158.41
Triglycerides: 137 mg/dL (ref 0.0–149.0)
VLDL: 27.4 mg/dL (ref 0.0–40.0)

## 2016-03-25 LAB — TSH: TSH: 2.97 u[IU]/mL (ref 0.35–4.50)

## 2016-03-25 NOTE — Telephone Encounter (Signed)
Pt had to re schedule her office appt  She will still get lab today- here is order

## 2016-04-22 ENCOUNTER — Other Ambulatory Visit: Payer: Self-pay | Admitting: Family Medicine

## 2016-04-24 ENCOUNTER — Other Ambulatory Visit: Payer: Self-pay | Admitting: Family Medicine

## 2016-04-24 NOTE — Telephone Encounter (Signed)
Please refill times 5 , thanks

## 2016-04-24 NOTE — Telephone Encounter (Signed)
Pt has CPE scheduled on 07/03/16, last filled on 05/15/15 #15 tabs with 4 additional refill, please advise

## 2016-04-24 NOTE — Telephone Encounter (Signed)
Ok to refill? Last filled 05/14/1614 4RF

## 2016-04-24 NOTE — Telephone Encounter (Signed)
Sent in as directed.

## 2016-04-24 NOTE — Telephone Encounter (Signed)
Please refill times 5 

## 2016-06-22 ENCOUNTER — Telehealth: Payer: Self-pay | Admitting: Family Medicine

## 2016-06-22 DIAGNOSIS — Z0000 Encounter for general adult medical examination without abnormal findings: Secondary | ICD-10-CM

## 2016-06-22 NOTE — Telephone Encounter (Signed)
-----   Message from Ellamae Sia sent at 06/18/2016 11:18 AM EST ----- Regarding: Lab orders for Friday, 12.1.17 Patient is scheduled for CPX labs, please order future labs, Thanks , Karna Christmas

## 2016-06-28 ENCOUNTER — Other Ambulatory Visit (INDEPENDENT_AMBULATORY_CARE_PROVIDER_SITE_OTHER): Payer: BLUE CROSS/BLUE SHIELD

## 2016-06-28 DIAGNOSIS — Z0000 Encounter for general adult medical examination without abnormal findings: Secondary | ICD-10-CM | POA: Diagnosis not present

## 2016-06-28 LAB — COMPREHENSIVE METABOLIC PANEL
ALBUMIN: 3.7 g/dL (ref 3.5–5.2)
ALK PHOS: 58 U/L (ref 39–117)
ALT: 15 U/L (ref 0–35)
AST: 15 U/L (ref 0–37)
BUN: 16 mg/dL (ref 6–23)
CALCIUM: 8.8 mg/dL (ref 8.4–10.5)
CHLORIDE: 110 meq/L (ref 96–112)
CO2: 23 mEq/L (ref 19–32)
Creatinine, Ser: 0.7 mg/dL (ref 0.40–1.20)
GFR: 96.05 mL/min (ref >60.00)
Glucose, Bld: 86 mg/dL (ref 70–99)
POTASSIUM: 4 meq/L (ref 3.5–5.1)
Sodium: 141 mEq/L (ref 135–145)
TOTAL PROTEIN: 7.1 g/dL (ref 6.0–8.3)
Total Bilirubin: 0.4 mg/dL (ref 0.2–1.2)

## 2016-06-28 LAB — LIPID PANEL
CHOLESTEROL: 209 mg/dL — AB (ref 0–200)
HDL: 47 mg/dL (ref >39.00)
LDL CALC: 134 mg/dL — AB (ref 0–99)
NonHDL: 161.63
TRIGLYCERIDES: 139 mg/dL (ref 0.0–149.0)
Total CHOL/HDL Ratio: 4
VLDL: 27.8 mg/dL (ref 0.0–40.0)

## 2016-06-28 LAB — CBC WITH DIFFERENTIAL/PLATELET
BASOS PCT: 0.6 % (ref 0.0–3.0)
Basophils Absolute: 0 10*3/uL (ref 0.0–0.1)
EOS PCT: 2.5 % (ref 0.0–5.0)
Eosinophils Absolute: 0.2 10*3/uL (ref 0.0–0.7)
HEMATOCRIT: 37.7 % (ref 36.0–46.0)
HEMOGLOBIN: 12.4 g/dL (ref 12.0–15.0)
LYMPHS PCT: 33.5 % (ref 12.0–46.0)
Lymphs Abs: 2.3 10*3/uL (ref 0.7–4.0)
MCHC: 33 g/dL (ref 30.0–36.0)
MCV: 79.6 fl (ref 78.0–100.0)
MONO ABS: 0.4 10*3/uL (ref 0.1–1.0)
MONOS PCT: 6 % (ref 3.0–12.0)
Neutro Abs: 3.9 10*3/uL (ref 1.4–7.7)
Neutrophils Relative %: 57.4 % (ref 43.0–77.0)
Platelets: 198 10*3/uL (ref 150.0–400.0)
RBC: 4.73 Mil/uL (ref 3.87–5.11)
RDW: 15.9 % — AB (ref 11.5–15.5)
WBC: 6.8 10*3/uL (ref 4.0–10.5)

## 2016-06-28 LAB — TSH: TSH: 7.38 u[IU]/mL — ABNORMAL HIGH (ref 0.35–4.50)

## 2016-07-03 ENCOUNTER — Ambulatory Visit (INDEPENDENT_AMBULATORY_CARE_PROVIDER_SITE_OTHER): Payer: BLUE CROSS/BLUE SHIELD | Admitting: Family Medicine

## 2016-07-03 ENCOUNTER — Encounter: Payer: Self-pay | Admitting: Family Medicine

## 2016-07-03 VITALS — BP 110/78 | HR 61 | Temp 98.3°F | Ht 67.0 in | Wt 217.8 lb

## 2016-07-03 DIAGNOSIS — Z0000 Encounter for general adult medical examination without abnormal findings: Secondary | ICD-10-CM

## 2016-07-03 DIAGNOSIS — E6609 Other obesity due to excess calories: Secondary | ICD-10-CM | POA: Diagnosis not present

## 2016-07-03 DIAGNOSIS — Z1231 Encounter for screening mammogram for malignant neoplasm of breast: Secondary | ICD-10-CM

## 2016-07-03 DIAGNOSIS — E039 Hypothyroidism, unspecified: Secondary | ICD-10-CM

## 2016-07-03 DIAGNOSIS — E7800 Pure hypercholesterolemia, unspecified: Secondary | ICD-10-CM

## 2016-07-03 DIAGNOSIS — Z6834 Body mass index (BMI) 34.0-34.9, adult: Secondary | ICD-10-CM

## 2016-07-03 LAB — POC URINALSYSI DIPSTICK (AUTOMATED)
BILIRUBIN UA: NEGATIVE
GLUCOSE UA: NEGATIVE
KETONES UA: NEGATIVE
Leukocytes, UA: NEGATIVE
NITRITE UA: NEGATIVE
Protein, UA: NEGATIVE
RBC UA: NEGATIVE
Spec Grav, UA: >=1.03
Urobilinogen, UA: 0.2
pH, UA: 5.5

## 2016-07-03 MED ORDER — PROMETHAZINE HCL 25 MG PO TABS: ORAL_TABLET | ORAL | 5 refills | Status: AC

## 2016-07-03 MED ORDER — TOPIRAMATE 100 MG PO TABS: 100.0000 mg | ORAL_TABLET | Freq: Two times a day (BID) | ORAL | 3 refills | Status: DC

## 2016-07-03 MED ORDER — LEVOTHYROXINE SODIUM 175 MCG PO TABS: 175.0000 ug | ORAL_TABLET | Freq: Every day | ORAL | 11 refills | Status: AC

## 2016-07-03 MED ORDER — SERTRALINE HCL 100 MG PO TABS: ORAL_TABLET | ORAL | 3 refills | Status: DC

## 2016-07-03 MED ORDER — RIZATRIPTAN BENZOATE 10 MG PO TABS
ORAL_TABLET | ORAL | 5 refills | Status: DC
Start: 2016-07-03 — End: 2016-10-03

## 2016-07-03 NOTE — Progress Notes (Signed)
Pre visit review using our clinic review tool, if applicable. No additional management support is needed unless otherwise documented below in the visit note. 

## 2016-07-03 NOTE — Assessment & Plan Note (Signed)
Lab Results  Component Value Date   TSH 7.38 (H) 06/28/2016   Inc levo to 175 mcg and re check 2 wk Is fatigued

## 2016-07-03 NOTE — Assessment & Plan Note (Signed)
Scheduled annual screening mammogram Nl breast exam today  Encouraged monthly self exams   

## 2016-07-03 NOTE — Assessment & Plan Note (Signed)
Disc goals for lipids and reasons to control them Rev labs with pt Rev low sat fat diet in detail LDL almost at goal  Given info on diet

## 2016-07-03 NOTE — Assessment & Plan Note (Signed)
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

## 2016-07-03 NOTE — Assessment & Plan Note (Signed)
Discussed how this problem influences overall health and the risks it imposes  Reviewed plan for weight loss with lower calorie diet (via better food choices and also portion control or program like weight watchers) and exercise building up to or more than 30 minutes 5 days per week including some aerobic activity    

## 2016-07-03 NOTE — Progress Notes (Signed)
Subjective:    Patient ID: Miranda Gardner, female    DOB: May 07, 1971, 45 y.o.   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  Drinks energy drinks also    Wt Readings from Last 3 Encounters:  07/03/16 217 lb 12 oz (98.8 kg)  12/19/15 220 lb 8 oz (100 kg)  06/26/15 219 lb 4 oz (99.5 kg)  wt is down 3 lb Trying to watch what she eats (stays away from junk food)  Unable to get to the gym - due to night shift / makes her too tired during the day  bmi 34.1  HIV screening -declines due to low risk   Mammogram -has been quite a while - 5/12 was the last one  [facility]  Needs a ref for the breast center  Self breast exam - no lumps today   Pap 8/16 nl (had hysterectomy)  Had a hysterectomy in 2016 for fibroids  Last visit gyn - 8/16  Tetanus shot 1/07- thinks she may have had one before nursing (she will check on that and let us know)   Flu shot 05/12/16  Hypothyroidism  Pt has no clinical changes Neg for hair or skin/ edema and no tremor  Pos for fatigue Lab Results  Component Value Date   TSH 7.38 (H) 06/28/2016     No missed doses Take at night - same time  No calcium with it  Takes with nightly meds    Here with hyperlipidemia Lab Results  Component Value Date   CHOL 209 (H) 06/28/2016   CHOL 208 (H) 03/25/2016   CHOL 197 09/05/2014   Lab Results  Component Value Date   HDL 47.00 06/28/2016   HDL 49.50 03/25/2016   HDL 50.60 09/05/2014   Lab Results  Component Value Date   LDLCALC 134 (H) 06/28/2016   LDLCALC 131 (H) 03/25/2016   LDLCALC 121 (H) 09/05/2014   Lab Results  Component Value Date   TRIG 139.0 06/28/2016   TRIG 137.0 03/25/2016   TRIG 128.0 09/05/2014   Lab Results    Component Value Date   CHOLHDL 4 06/28/2016   CHOLHDL 4 03/25/2016   CHOLHDL 4 09/05/2014   Lab Results  Component Value Date   LDLDIRECT 139.8 04/22/2012   LDLDIRECT 154.4 06/15/2010  HDL down -less exercise  LDL is 134 -goal is 130    Hx of headaches on topamax   Hx of depression  On zoloft Has been very stressed as a single parent / financial stress and work  Not exercising lately   Results for orders placed or performed in visit on 07/03/16  HM PAP SMEAR  Result Value Ref Range   HM Pap smear nl per pt gyn    POCT Urinalysis Dipstick (Automated)  Result Value Ref Range   Color, UA Yellow    Clarity, UA Hazy    Glucose, UA Negative    Bilirubin, UA Negative    Ketones, UA Negative    Spec Grav, UA >=1.030    Blood, UA Negative    pH, UA 5.5    Protein, UA Negative    Urobilinogen, UA 0.2    Nitrite, UA  Negative    Leukocytes, UA Negative Negative     Review of Systems    Review of Systems  Constitutional: Negative for fever, appetite change, fatigue and unexpected weight change.  Eyes: Negative for pain and visual disturbance.  Respiratory: Negative for cough and shortness of breath.   Cardiovascular: Negative for cp or palpitations    Gastrointestinal: Negative for nausea, diarrhea and constipation.  Genitourinary: Negative for urgency and frequency. neg for dysuria  Skin: Negative for pallor or rash   MSK pos for chronic R flank pain  Neurological: Negative for weakness, light-headedness, numbness and headaches.  Hematological: Negative for adenopathy. Does not bruise/bleed easily.  Psychiatric/Behavioral: Negative for dysphoric mood. The patient is not nervous/anxious.      Objective:   Physical Exam  Constitutional: She appears well-developed and well-nourished. No distress.  obese and well appearing   HENT:  Head: Normocephalic and atraumatic.  Right Ear: External ear normal.  Left Ear: External ear normal.  Mouth/Throat: Oropharynx is clear  and moist.  Eyes: Conjunctivae and EOM are normal. Pupils are equal, round, and reactive to light. No scleral icterus.  Neck: Normal range of motion. Neck supple. No JVD present. Carotid bruit is not present. No thyromegaly present.  Cardiovascular: Normal rate, regular rhythm, normal heart sounds and intact distal pulses.  Exam reveals no gallop.   Pulmonary/Chest: Effort normal and breath sounds normal. No respiratory distress. She has no wheezes. She exhibits no tenderness.  Abdominal: Soft. Bowel sounds are normal. She exhibits no distension, no abdominal bruit and no mass. There is no tenderness.  Genitourinary: No breast swelling, tenderness, discharge or bleeding.  Genitourinary Comments: Breast exam: No mass, nodules, thickening, tenderness, bulging, retraction, inflamation, nipple discharge or skin changes noted.  No axillary or clavicular LA.      Musculoskeletal: Normal range of motion. She exhibits no edema or tenderness.  Lymphadenopathy:    She has no cervical adenopathy.  Neurological: She is alert. She has normal reflexes. No cranial nerve deficit. She exhibits normal muscle tone. Coordination normal.  Skin: Skin is warm and dry. No rash noted. No erythema. No pallor.  Stable brown nevi and skin tags on trunk/back  Psychiatric: She has a normal mood and affect.          Assessment & Plan:   Problem List Items Addressed This Visit      Endocrine   Hypothyroidism    Lab Results  Component Value Date   TSH 7.38 (H) 06/28/2016   Inc levo to 175 mcg and re check 2 wk Is fatigued       Relevant Medications   levothyroxine (SYNTHROID, LEVOTHROID) 175 MCG tablet     Other   Screening mammogram, encounter for    Scheduled annual screening mammogram Nl breast exam today  Encouraged monthly self exams        Relevant Orders   MM DIGITAL SCREENING BILATERAL   Routine general medical examination at a health care facility - Primary    Reviewed health habits including  diet and exercise and skin cancer prevention Reviewed appropriate screening tests for age  Also reviewed health mt list, fam hx and immunization status , as well as social and family history   See HPI Labs rev  UA today  Mammogram ref done  Will inc thyroid dose and re check 6 wk  Refilled med  Disc strategies for sleep with shift work      Relevant Orders   POCT Urinalysis Dipstick (Automated) (Completed)  Obesity    Discussed how this problem influences overall health and the risks it imposes  Reviewed plan for weight loss with lower calorie diet (via better food choices and also portion control or program like weight watchers) and exercise building up to or more than 30 minutes 5 days per week including some aerobic activity         Hyperlipidemia    Disc goals for lipids and reasons to control them Rev labs with pt Rev low sat fat diet in detail LDL almost at goal  Given info on diet

## 2016-07-03 NOTE — Patient Instructions (Addendum)
Please check on the date of your last Tdap and let me know  Increase your levothyroxine from 150 to 175 mcg daily  Schedule TSH lab in about 6 weeks   For cholesterol   Avoid red meat/ fried foods/ egg yolks/ fatty breakfast meats/ butter, cheese and high fat dairy/ and shellfish    Leave a urine sample on the way out   Follow up in 6 weeks  Stop at check out for mammogram ref

## 2016-09-11 ENCOUNTER — Telehealth: Payer: Self-pay | Admitting: Family Medicine

## 2016-09-11 DIAGNOSIS — G43909 Migraine, unspecified, not intractable, without status migrainosus: Secondary | ICD-10-CM

## 2016-09-11 NOTE — Telephone Encounter (Signed)
Referral done Will route to [facility] 

## 2016-09-11 NOTE — Telephone Encounter (Signed)
Patient needs New Neurology referral to [facility] for her migraines. You placed a referral previously and she could never go and now she can.

## 2016-09-12 ENCOUNTER — Ambulatory Visit: Payer: BLUE CROSS/BLUE SHIELD

## 2016-10-03 ENCOUNTER — Ambulatory Visit (INDEPENDENT_AMBULATORY_CARE_PROVIDER_SITE_OTHER): Payer: BLUE CROSS/BLUE SHIELD | Admitting: Neurology

## 2016-10-03 ENCOUNTER — Encounter: Payer: Self-pay | Admitting: Neurology

## 2016-10-03 VITALS — BP 112/68 | HR 78 | Ht 67.0 in | Wt 209.0 lb

## 2016-10-03 DIAGNOSIS — G43909 Migraine, unspecified, not intractable, without status migrainosus: Secondary | ICD-10-CM

## 2016-10-03 MED ORDER — NORTRIPTYLINE HCL 25 MG PO CAPS: 50.0000 mg | ORAL_CAPSULE | Freq: Every day | ORAL | 11 refills | Status: DC

## 2016-10-03 MED ORDER — RIZATRIPTAN BENZOATE 10 MG PO TBDP: 10.0000 mg | ORAL_TABLET | ORAL | 11 refills | Status: AC | PRN

## 2016-10-03 MED ORDER — SUMATRIPTAN SUCCINATE 6 MG/0.5ML ~~LOC~~ SOLN: 6.0000 mg | SUBCUTANEOUS | 6 refills | Status: AC | PRN

## 2016-10-03 NOTE — Patient Instructions (Signed)
Magnesium oxide 400 mg twice a day Riboflavin  100 mg twice a day 

## 2016-10-03 NOTE — Progress Notes (Signed)
PATIENT: Miranda Gardner DOB: Apr 02, 1971  Chief Complaint  Patient presents with  . Migraine    Lately, she has been having 2-4 headache days per week.  She is currently taking Topamax 100mg , qhs.  She had numbness/tingling in her hands with 200mg  daily. Maxalt is somewhat helpful.  Additionally, she takes approximately #30 tablets each week of OTC ibuprofen. 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  Marland Kitchen PCP    Abner Greenspan, MD     HISTORICAL  KHOLE ARTERBURN is a 46 years old right-handed female, seen in refer by her primary care doctor  Abner Greenspan for evaluation of migraine, initial evaluation was on October 03 2016.  She had a history of kidney stone, chronic migraine since 2013, has been treated with Topamax 100 mg every night, could not tolerate 200 mg daily, because of paresthesia of hands, brain fog.  Her typical migraine left retro-orbital area severe pounding headache with associated light noise smell sensitivity, lasting 1-2 days,  Trigger for her migraines are sleep deprivation, stress, weather change, she is now working on third shift has difficulty sleeping  Over the years she has been taking Maxalt 10 mg tablets and Phenergan as needed for migraine, oftentimes she needs second dose,  In addition she is taking frequent Advil 8 tablets, 4-5 times each week  REVIEW OF SYSTEMS: Full 14 system review of systems performed and notable only for fatigue, blurred vision, headache, insomnia, shift work, anxiety, not enough sleep, decreased energy, racing thoughts  ALLERGIES: Allergies  Allergen Reactions  . Ciprofloxacin     REACTION: nausea and vomiting  . Erythromycin     REACTION: nausea and vomiting  . Morphine     REACTION: headache    HOME MEDICATIONS: Current Outpatient Prescriptions  Medication Sig Dispense Refill  . ibuprofen (ADVIL,MOTRIN) 200 MG tablet Take 200 mg by mouth as needed.    Marland Kitchen  ibuprofen (ADVIL,MOTRIN) 600 MG tablet Take 1 tablet (600 mg total) by mouth every 6 (six) hours as needed (mild pain). 60 tablet 0  . levothyroxine (SYNTHROID, LEVOTHROID) 175 MCG tablet Take 1 tablet (175 mcg total) by mouth daily before breakfast. 30 tablet 11  . MELATONIN PO Take 20 mg by mouth at bedtime.    . promethazine (PHENERGAN) 25 MG tablet TAKE 1 TABLET BY MOUTH EVERY 8 HOURS AS NEEDED FOR NAUSEA WITH HEADACHE 15 tablet 5  . rizatriptan (MAXALT) 10 MG tablet TAKE 1 TABLET BY MOUTH AS NEEDED AT ONSET OF MIGRAINE, MAY REPEAT 1 DOSE IN 2 HOURS IF NEEDED 27 tablet 5  . sertraline (ZOLOFT) 100 MG tablet TAKE 1 & 1/2 TABLET BY MOUTH ONCE A DAY 135 tablet 3  . topiramate (TOPAMAX) 100 MG tablet Take 1 tablet (100 mg total) by mouth 2 (two) times daily. 180 tablet 3   No current facility-administered medications for this visit.     PAST MEDICAL HISTORY: Past Medical History:  Diagnosis Date  . Abnormal uterine bleeding (AUB)   . Anemia    mild  . Anxiety   . Arthritis    rt ankle  . Constipation   . Family history of adverse reaction to anesthesia    mom had low O2 sats  . GERD (gastroesophageal reflux disease)   . History of kidney stones   . Hypothyroidism   . Migraine, menstrual   . Uterine fibroid     PAST SURGICAL HISTORY:  Past Surgical History:  Procedure Laterality Date  . CESAREAN SECTION    . CHOLECYSTECTOMY    . LAPAROSCOPIC ASSISTED VAGINAL HYSTERECTOMY N/A 01/16/2015   Procedure: LAPAROSCOPIC ASSISTED VAGINAL HYSTERECTOMY;  Surgeon: Dian Queen, MD;  Location: [place];  Service: Gynecology;  Laterality: N/A;    FAMILY HISTORY: Family History  Problem Relation Age of Onset  . Diabetes Mother   . Anemia Mother     pernicioius  . Sleep apnea Mother   . Obesity Mother   . Other Father     Unsure of medical history  . Heart attack Maternal Grandmother   . Stroke Maternal Grandfather     SOCIAL HISTORY:  Social History   Social  History  . Marital status: Single    Spouse name: N/A  . Number of children: 1  . Years of education: Bachelors   Occupational History  . RN [employer]   Social History Main Topics  . Smoking status: Never Smoker  . Smokeless tobacco: Never Used  . Alcohol use 0.0 oz/week     Comment: Social -- 1-2 glasses of wine  . Drug use: No  . Sexual activity: Not on file   Other Topics Concern  . Not on file   Social History Narrative   Lives at home with mother and daughter.   Right-handed.   Coffee occasionally, 1-2 cups hot tea per day, 1-3 Red Bulls each weekend.     PHYSICAL EXAM   Vitals:   10/03/16 1058  BP: 112/68  Pulse: 78  Weight: 209 lb (94.8 kg)  Height: 5\' 7"  (1.702 m)    Not recorded      Body mass index is 32.73 kg/m.  PHYSICAL EXAMNIATION:  Gen: NAD, conversant, well nourised, obese, well groomed                     Cardiovascular: Regular rate rhythm, no peripheral edema, warm, nontender. Eyes: Conjunctivae clear without exudates or hemorrhage Neck: Supple, no carotid bruits. Pulmonary: Clear to auscultation bilaterally   NEUROLOGICAL EXAM:  MENTAL STATUS: Speech:    Speech is normal; fluent and spontaneous with normal comprehension.  Cognition:     Orientation to time, place and person     Normal recent and remote memory     Normal Attention span and concentration     Normal Language, naming, repeating,spontaneous speech     Fund of knowledge   CRANIAL NERVES: CN II: Visual fields are full to confrontation. Fundoscopic exam is normal with sharp discs and no vascular changes. Pupils are round equal and briskly reactive to light. CN III, IV, VI: extraocular movement are normal. No ptosis. CN V: Facial sensation is intact to pinprick in all 3 divisions bilaterally. Corneal responses are intact.  CN VII: Face is symmetric with normal eye closure and smile. CN VIII: Hearing is normal to rubbing fingers CN IX, X: Palate elevates  symmetrically. Phonation is normal. CN XI: Head turning and shoulder shrug are intact CN XII: Tongue is midline with normal movements and no atrophy.  MOTOR: There is no pronator drift of out-stretched arms. Muscle bulk and tone are normal. Muscle strength is normal.  REFLEXES: Reflexes are 2+ and symmetric at the biceps, triceps, knees, and ankles. Plantar responses are flexor.  SENSORY: Intact to light touch, pinprick, positional sensation and vibratory sensation are intact in fingers and toes.  COORDINATION: Rapid alternating movements and fine finger movements are intact. There is no dysmetria on  finger-to-nose and heel-knee-shin.    GAIT/STANCE: Posture is normal. Gait is steady with normal steps, base, arm swing, and turning. Heel and toe walking are normal. Tandem gait is normal.  Romberg is absent.   DIAGNOSTIC DATA (LABS, IMAGING, TESTING) - I reviewed patient records, labs, notes, testing and imaging myself where available.   ASSESSMENT AND PLAN  SARAIYA KOZMA is a 46 y.o. female   Chronic migraine  Also component of medicine rebound headaches  She had a history of kidney stone, Topamax is no longer good choice,  Start nortriptyline 25 mg titrating to 50 mg every night as preventative medications  I also suggested magnesium oxide, riboflavin as preventive medications  Imitrex subcutaneous injection  Chronic insomnia, night shift work  Melatonin, hope nortriptyline is helpful   Marcial Pacas, M.D. Ph.D.  [facility] [address] [place], [place] [address] Ph: [phone] Fax: [phone]  CC:Abner Greenspan, MD

## 2016-10-23 ENCOUNTER — Encounter: Payer: Self-pay | Admitting: Family Medicine

## 2016-10-23 ENCOUNTER — Ambulatory Visit (INDEPENDENT_AMBULATORY_CARE_PROVIDER_SITE_OTHER): Payer: BLUE CROSS/BLUE SHIELD | Admitting: Family Medicine

## 2016-10-23 VITALS — BP 96/62 | HR 68 | Temp 98.2°F | Ht 67.0 in | Wt 209.0 lb

## 2016-10-23 DIAGNOSIS — G43909 Migraine, unspecified, not intractable, without status migrainosus: Secondary | ICD-10-CM

## 2016-10-23 DIAGNOSIS — R42 Dizziness and giddiness: Secondary | ICD-10-CM

## 2016-10-23 MED ORDER — MECLIZINE HCL 25 MG PO TABS: 25.0000 mg | ORAL_TABLET | Freq: Three times a day (TID) | ORAL | 1 refills | Status: AC | PRN

## 2016-10-23 NOTE — Progress Notes (Signed)
Pre visit review using our clinic review tool, if applicable. No additional management support is needed unless otherwise documented below in the visit note. 

## 2016-10-23 NOTE — Progress Notes (Signed)
Subjective:    Patient ID: Miranda Gardner, female    DOB: 05/29/1971, 46 y.o.   MRN: [id number]  HPI Here for dizziness  Since yesterday  It is positional - happens with turning head  Feels like spinning  Some nausea  Never had vertigo before No congestion / no cold symptoms   Ears feel ok   L side of head (migraine side ) feels tingly but no headache   Tried changing from topamax to mag and B2 and tried nortriptyline  She had a severe migraine and switched it back  Injectable imitrex works well  Cut back on ibuprofen    Wt Readings from Last 3 Encounters:  10/23/16 209 lb (94.8 kg)  10/03/16 209 lb (94.8 kg)  07/03/16 217 lb 12 oz (98.8 kg)    Patient Active Problem List   Diagnosis Date Noted  . Screening mammogram, encounter for 07/03/2016  . S/P laparoscopic assisted vaginal hysterectomy (LAVH) 01/16/2015  . Kidney stones 04/22/2012  . Right flank pain 04/22/2012  . Routine general medical examination at a health care facility 04/14/2012  . Fatigue 11/27/2011  . Obesity 11/27/2011  . Sleep disorder 12/07/2010  . Migraine syndrome 12/07/2010  . Hyperlipidemia 12/28/2008  . FATIGUE 12/15/2007  . Hypothyroidism 09/10/2007  . DEPRESSION 09/10/2007  . ASTHMA, CHILDHOOD 09/10/2007  . GERD 09/10/2007   Past Medical History:  Diagnosis Date  . Abnormal uterine bleeding (AUB)   . Anemia    mild  . Anxiety   . Arthritis    rt ankle  . Constipation   . Family history of adverse reaction to anesthesia    mom had low O2 sats  . GERD (gastroesophageal reflux disease)   . History of kidney stones   . Hypothyroidism   . Migraine, menstrual   . Uterine fibroid    Past Surgical History:  Procedure Laterality Date  . CESAREAN SECTION    . CHOLECYSTECTOMY    . LAPAROSCOPIC ASSISTED VAGINAL HYSTERECTOMY N/A 01/16/2015   Procedure: LAPAROSCOPIC ASSISTED VAGINAL HYSTERECTOMY;  Surgeon: Dian Queen, MD;  Location: [place];  Service:  Gynecology;  Laterality: N/A;   Social History  Substance Use Topics  . Smoking status: Never Smoker  . Smokeless tobacco: Never Used  . Alcohol use 0.0 oz/week     Comment: Social -- 1-2 glasses of wine   Family History  Problem Relation Age of Onset  . Diabetes Mother   . Anemia Mother     pernicioius  . Sleep apnea Mother   . Obesity Mother   . Other Father     Unsure of medical history  . Heart attack Maternal Grandmother   . Stroke Maternal Grandfather    Allergies  Allergen Reactions  . Ciprofloxacin     REACTION: nausea and vomiting  . Erythromycin     REACTION: nausea and vomiting  . Morphine     REACTION: headache   Current Outpatient Prescriptions on File Prior to Visit  Medication Sig Dispense Refill  . levothyroxine (SYNTHROID, LEVOTHROID) 175 MCG tablet Take 1 tablet (175 mcg total) by mouth daily before breakfast. 30 tablet 11  . MELATONIN PO Take 20 mg by mouth at bedtime.    . promethazine (PHENERGAN) 25 MG tablet TAKE 1 TABLET BY MOUTH EVERY 8 HOURS AS NEEDED FOR NAUSEA WITH HEADACHE 15 tablet 5  . rizatriptan (MAXALT-MLT) 10 MG disintegrating tablet Take 1 tablet (10 mg total) by mouth as needed. May repeat in 2 hours if  needed 15 tablet 11  . sertraline (ZOLOFT) 100 MG tablet TAKE 1 & 1/2 TABLET BY MOUTH ONCE A DAY 135 tablet 3  . SUMAtriptan (IMITREX) 6 MG/0.5ML SOLN injection Inject 0.5 mLs (6 mg total) into the skin every 2 (two) hours as needed for migraine or headache. May repeat in 2 hours if headache persists or recurs. 12 vial 6   No current facility-administered medications on file prior to visit.     Review of Systems Review of Systems  Constitutional: Negative for fever, appetite change, fatigue and unexpected weight change.  Eyes: Negative for pain and visual disturbance.  Respiratory: Negative for cough and shortness of breath.   Cardiovascular: Negative for cp or palpitations    Gastrointestinal: Negative for nausea, diarrhea and  constipation.  Genitourinary: Negative for urgency and frequency.  Skin: Negative for pallor or rash   Neurological: Negative for weakness, light-headedness, numbness and  Pos for headaches. pos for vertigo type dizziness  Hematological: Negative for adenopathy. Does not bruise/bleed easily.  Psychiatric/Behavioral: Negative for dysphoric mood. The patient is not nervous/anxious.         Objective:   Physical Exam  Constitutional: She is oriented to person, place, and time. She appears well-developed and well-nourished. No distress.  obese and well appearing   HENT:  Head: Normocephalic and atraumatic.  Right Ear: External ear normal.  Left Ear: External ear normal.  Nose: Nose normal.  Mouth/Throat: Oropharynx is clear and moist. No oropharyngeal exudate.  No sinus tenderness No temporal tenderness  No TMJ tenderness  Eyes: Conjunctivae and EOM are normal. Pupils are equal, round, and reactive to light. Right eye exhibits no discharge. Left eye exhibits no discharge. No scleral icterus.  2-3 beats of horizontal  Nystagmus-bilateral  Neck: Normal range of motion and full passive range of motion without pain. Neck supple. No JVD present. Carotid bruit is not present. No tracheal deviation present. No thyromegaly present.  Cardiovascular: Normal rate, regular rhythm and normal heart sounds.   No murmur heard. Pulmonary/Chest: Effort normal and breath sounds normal. No respiratory distress. She has no wheezes. She has no rales.  Abdominal: Soft. Bowel sounds are normal. She exhibits no distension and no mass. There is no tenderness.  Musculoskeletal: She exhibits no edema or tenderness.  Lymphadenopathy:    She has no cervical adenopathy.  Neurological: She is alert and oriented to person, place, and time. She has normal strength and normal reflexes. She displays no atrophy and no tremor. No cranial nerve deficit or sensory deficit. She exhibits normal muscle tone. She displays a  negative Romberg sign. Coordination and gait normal.  No focal cerebellar signs   Skin: Skin is warm and dry. No rash noted. No pallor.  Psychiatric: She has a normal mood and affect. Her behavior is normal. Thought content normal.          Assessment & Plan:   Problem List Items Addressed This Visit      Cardiovascular and Mediastinum   Migraine syndrome    Struggling more lately with medication changes-now doing a bit better emph imp of headache lifestyle changes (regular sleep habits) f/u with neuro      Relevant Medications   topiramate (TOPAMAX) 100 MG tablet     Other   Vertigo    Benign positional  Unsure if part of her headache syndrome or worsened by recent medicine changes  Reassuring exam  Px meclizine for tid prn use with warning of sedation  Disc imp of changing  pos slowly  Update if not starting to improve in a week or if worsening

## 2016-10-23 NOTE — Patient Instructions (Signed)
I think you have some vertigo  Change in medication may affect it  Drink fluids Change position slowly  Take meclizine as needed with caution of sedation   Update if not starting to improve in a week or if worsening

## 2016-10-25 NOTE — Assessment & Plan Note (Signed)
Struggling more lately with medication changes-now doing a bit better emph imp of headache lifestyle changes (regular sleep habits) f/u with neuro

## 2016-10-25 NOTE — Assessment & Plan Note (Signed)
Benign positional  Unsure if part of her headache syndrome or worsened by recent medicine changes  Reassuring exam  Px meclizine for tid prn use with warning of sedation  Disc imp of changing pos slowly  Update if not starting to improve in a week or if worsening

## 2017-01-09 ENCOUNTER — Ambulatory Visit: Payer: BLUE CROSS/BLUE SHIELD | Admitting: Neurology

## 2017-07-23 ENCOUNTER — Telehealth: Payer: Self-pay | Admitting: *Deleted

## 2017-07-23 NOTE — Telephone Encounter (Signed)
Please schedule PE in the spring and refill until then Thanks

## 2017-07-23 NOTE — Telephone Encounter (Signed)
No recent or future appts., please advise  

## 2017-07-24 NOTE — Telephone Encounter (Signed)
Left VM requesting pt to call the office back and schedule her CPE before we can refill her med (CRM created)

## 2017-07-31 NOTE — Telephone Encounter (Signed)
Left 2nd VM requesting pt to call the office back and schedule a CPE (CRM noted)

## 2017-08-14 MED ORDER — SERTRALINE HCL 100 MG PO TABS: ORAL_TABLET | ORAL | 0 refills | Status: AC

## 2017-08-14 NOTE — Telephone Encounter (Signed)
Med refilled it was the sertraline

## 2017-08-14 NOTE — Addendum Note (Signed)
Addended by: Tammi Sou on: 08/14/2017 04:23 PM   Modules accepted: Orders

## 2017-08-14 NOTE — Telephone Encounter (Signed)
Patient scheduled for 09/26/17 @2 :30, can meds be filled?

## 2017-08-14 NOTE — Telephone Encounter (Signed)
Do not see which meds are being requested refilled.Please advise.

## 2017-09-26 ENCOUNTER — Encounter: Payer: BLUE CROSS/BLUE SHIELD | Admitting: Family Medicine

## 2018-02-24 ENCOUNTER — Ambulatory Visit: Payer: BLUE CROSS/BLUE SHIELD | Admitting: Neurology

## 2018-09-22 ENCOUNTER — Ambulatory Visit: Payer: Self-pay | Admitting: Neurology
